# Patient Record
Sex: Female | Born: 1945
Health system: Southern US, Community
[De-identification: ages and names within clinical notes are randomized; demographics above are authoritative.]

## PROBLEM LIST (undated history)

## (undated) DIAGNOSIS — E785 Hyperlipidemia, unspecified: Secondary | ICD-10-CM

## (undated) DIAGNOSIS — I10 Essential (primary) hypertension: Secondary | ICD-10-CM

## (undated) HISTORY — PX: BREAST LUMPECTOMY: SHX2

## (undated) HISTORY — DX: Hyperlipidemia, unspecified: E78.5

## (undated) HISTORY — DX: Essential (primary) hypertension: I10

## (undated) HISTORY — PX: TANDEM AND OVOID INSERTION: SHX2480

---

## 1997-08-12 ENCOUNTER — Other Ambulatory Visit: Admission: RE | Admit: 1997-08-12 | Discharge: 1997-08-12 | Payer: Self-pay | Admitting: *Deleted

## 1998-09-06 ENCOUNTER — Other Ambulatory Visit: Admission: RE | Admit: 1998-09-06 | Discharge: 1998-09-06 | Payer: Self-pay | Admitting: *Deleted

## 1999-08-22 ENCOUNTER — Other Ambulatory Visit: Admission: RE | Admit: 1999-08-22 | Discharge: 1999-08-22 | Payer: Self-pay | Admitting: *Deleted

## 2000-09-27 ENCOUNTER — Other Ambulatory Visit: Admission: RE | Admit: 2000-09-27 | Discharge: 2000-09-27 | Payer: Self-pay | Admitting: Obstetrics and Gynecology

## 2000-10-03 ENCOUNTER — Ambulatory Visit (HOSPITAL_COMMUNITY): Admission: RE | Admit: 2000-10-03 | Discharge: 2000-10-03 | Payer: Self-pay | Admitting: Gastroenterology

## 2002-04-07 ENCOUNTER — Other Ambulatory Visit: Admission: RE | Admit: 2002-04-07 | Discharge: 2002-04-07 | Payer: Self-pay | Admitting: Obstetrics and Gynecology

## 2002-07-30 ENCOUNTER — Encounter: Admission: RE | Admit: 2002-07-30 | Discharge: 2002-07-30 | Payer: Self-pay | Admitting: Family Medicine

## 2004-02-22 ENCOUNTER — Ambulatory Visit: Payer: Self-pay | Admitting: Internal Medicine

## 2004-03-03 ENCOUNTER — Ambulatory Visit: Payer: Self-pay | Admitting: Internal Medicine

## 2004-04-10 ENCOUNTER — Ambulatory Visit: Payer: Self-pay | Admitting: Internal Medicine

## 2004-09-12 ENCOUNTER — Ambulatory Visit: Payer: Self-pay | Admitting: Cardiology

## 2004-09-25 ENCOUNTER — Ambulatory Visit: Payer: Self-pay | Admitting: Internal Medicine

## 2005-02-12 ENCOUNTER — Ambulatory Visit: Payer: Self-pay | Admitting: Internal Medicine

## 2005-09-18 ENCOUNTER — Ambulatory Visit: Payer: Self-pay | Admitting: Internal Medicine

## 2005-09-27 ENCOUNTER — Ambulatory Visit: Payer: Self-pay | Admitting: Internal Medicine

## 2005-10-12 ENCOUNTER — Ambulatory Visit: Payer: Self-pay | Admitting: Internal Medicine

## 2006-01-03 ENCOUNTER — Ambulatory Visit: Payer: Self-pay | Admitting: Internal Medicine

## 2006-02-01 ENCOUNTER — Ambulatory Visit: Payer: Self-pay | Admitting: Internal Medicine

## 2006-10-07 ENCOUNTER — Ambulatory Visit: Payer: Self-pay | Admitting: Internal Medicine

## 2006-10-24 ENCOUNTER — Ambulatory Visit: Payer: Self-pay | Admitting: Internal Medicine

## 2007-04-23 ENCOUNTER — Ambulatory Visit: Payer: Self-pay | Admitting: Internal Medicine

## 2007-05-12 ENCOUNTER — Ambulatory Visit: Payer: Self-pay | Admitting: Internal Medicine

## 2007-09-30 ENCOUNTER — Encounter: Admission: RE | Admit: 2007-09-30 | Discharge: 2007-09-30 | Payer: Self-pay | Admitting: Internal Medicine

## 2007-11-20 ENCOUNTER — Ambulatory Visit (HOSPITAL_COMMUNITY): Admission: RE | Admit: 2007-11-20 | Discharge: 2007-11-20 | Payer: Self-pay | Admitting: Obstetrics and Gynecology

## 2008-03-01 ENCOUNTER — Other Ambulatory Visit: Admission: RE | Admit: 2008-03-01 | Discharge: 2008-03-01 | Payer: Self-pay | Admitting: Obstetrics and Gynecology

## 2008-03-04 ENCOUNTER — Ambulatory Visit: Payer: Self-pay | Admitting: Internal Medicine

## 2008-03-19 ENCOUNTER — Ambulatory Visit: Payer: Self-pay | Admitting: Internal Medicine

## 2008-09-09 ENCOUNTER — Ambulatory Visit: Payer: Self-pay | Admitting: Internal Medicine

## 2008-10-15 ENCOUNTER — Ambulatory Visit: Payer: Self-pay | Admitting: Internal Medicine

## 2008-11-04 LAB — CONVERTED CEMR LAB: CRP, High Sensitivity: 1.5 (ref 0.00–5.00)

## 2009-03-07 ENCOUNTER — Other Ambulatory Visit: Admission: RE | Admit: 2009-03-07 | Discharge: 2009-03-07 | Payer: Self-pay | Admitting: Obstetrics and Gynecology

## 2009-03-11 ENCOUNTER — Telehealth: Payer: Self-pay | Admitting: Internal Medicine

## 2009-03-15 ENCOUNTER — Ambulatory Visit: Payer: Self-pay | Admitting: Internal Medicine

## 2009-03-16 DIAGNOSIS — E782 Mixed hyperlipidemia: Secondary | ICD-10-CM | POA: Insufficient documentation

## 2009-03-16 DIAGNOSIS — E785 Hyperlipidemia, unspecified: Secondary | ICD-10-CM

## 2009-03-16 DIAGNOSIS — I10 Essential (primary) hypertension: Secondary | ICD-10-CM | POA: Insufficient documentation

## 2009-03-21 ENCOUNTER — Ambulatory Visit: Payer: Self-pay | Admitting: Internal Medicine

## 2010-01-25 ENCOUNTER — Telehealth: Payer: Self-pay | Admitting: Internal Medicine

## 2010-01-25 ENCOUNTER — Encounter: Payer: Self-pay | Admitting: Internal Medicine

## 2010-02-09 NOTE — Progress Notes (Signed)
Summary: BLOOD WORK ORDERS  Phone Note Call from Patient Call back at Home Phone [phone]   Caller: Patient Reason for Call: Talk to Nurse Summary of Call: PT STATES SHE NEEDS BLOOD WORK ORDERS BEFORE HER APPT ON 04/07/10.  Initial call taken by: Roe Coombs,  January 25, 2010 4:29 PM  Follow-up for Phone Call        Phone Call Completed  SPOKE WITH PT TO SEE DR STALLINGS NEXT WEEK WITH BLOOD DRAW TO BE DONE THERE ORDER FAXED TO DRAW LIPOMED AND LIVER IN ADDITION TO BLOOD TO BE DONE. PT TO CALL IF UNABLE TO DO LIPOMED  AT DR [facility] OFFICE. Follow-up by: Scherrie Bateman, LPN,  January 25, 2010 5:08 PM

## 2010-02-09 NOTE — Medication Information (Signed)
Summary: Lab Orders  Lab Orders   Imported By: Marylou Mccoy 02/02/2010 10:20:27  _____________________________________________________________________  External Attachment:    Type:   Image     Comment:   External Document

## 2010-02-09 NOTE — Assessment & Plan Note (Signed)
Summary: 1 YR F/U   Visit Type:  Follow-up Primary Provider:  Dr Eldred Manges at [facility] family pratice (pt has not had an ov yet)  CC:  no cardiac complaints.  History of Present Illness: patient is a 65 year old with a history of dyslipidemia(mild) and borderline hypertension.  I saw her in clinic 1 year ago. Since seen, she denies chest pain.  Breathing is ok.  She has increased her activity significantly by taking up dancing.  Also working out.  Current Medications (verified): 1)  Caicum .... 2 Tabs Three Times A Day 2)  Vitamin D 2000 Unit  Tabs (Cholecalciferol) .Marland Kitchen.. 1 Tab Daily 3)  Fish Oil   Oil (Fish Oil) .... Omega 3  1 Tab Three Times A Day 4)  Garlic Pill .Marland Kitchen.. 1 Once Daily 5)  Phytosterol Complex 500 Mg Caps (Phytosterol Esters) .... 2 Tabs Bid 6)  Multivitamins   Tabs (Multiple Vitamin) .Marland Kitchen.. 1 Tab Qd 7)  Aspirin 81 Mg  Tabs (Aspirin) .... (On Hold Due To Upcoming Procedure)  Allergies (verified): No Known Drug Allergies  Past History:  Past Medical History: Last updated: 03/16/2009 Current Problems:  DYSLIPIDEMIA (ICD-272.4) HYPERTENSION (ICD-401.9)  Past Surgical History: Lumpectomy Tand A  Family History: Father:  DM, dementia Mother:dementia MGF ; heart dz 21s  Social History: Married.  No tobacco. Occasional ETOH  Review of Systems       ALl systems reviewed.  Negative to above problem except as noted above.  Vital Signs:  Patient profile:   65 year old female Height:      64 inches Weight:      132 pounds BMI:     22.74 Pulse rate:   97 / minute BP sitting:   157 / 88  (left arm) Cuff size:   regular  Vitals Entered By: Burnett Kanaris, CNA (March 21, 2009 11:19 AM)  Physical Exam  Additional Exam:  Patient is in NAD HEENT:  Normocephalic, atraumatic. EOMI, PERRLA.  Neck: JVP is normal. No thyromegaly. No bruits.  Lungs: clear to auscultation. No rales no wheezes.  Heart: Regular rate and rhythm. Normal S1, S2. No S3.   No significant  murmurs. PMI not displaced.  Abdomen:  Supple, nontender. Normal bowel sounds. No masses. No hepatomegaly.  Extremities:   Good distal pulses throughout. No lower extremity edema.  Musculoskeletal :moving all extremities.  Neuro:   alert and oriented x3.    Impression & Recommendations:  Problem # 1:  DYSLIPIDEMIA (ICD-272.4) I have reviewed her lipomed with her.  Overall, not bad.  I would continue diet and exercise.  F/U 1 year  Problem # 2:  HYPERTENSION (ICD-401.9) Assessment: Unchanged BP here is up.  She brings in log from home.  Average in 130s/  (116-155)   Most in 130s/ She will follow.  Have bp cuff calibrated. Her updated medication list for this problem includes:    Aspirin 81 Mg Tabs (Aspirin) ..... (on hold due to upcoming procedure)  Orders: EKG w/ Interpretation (93000)  Patient Instructions: 1)  F/U 1 yr. 2)  Monitor bp.

## 2010-02-09 NOTE — Progress Notes (Signed)
Summary: lab work   Phone Note Call from Patient Call back at [employer] [phone]   Caller: Patient Reason for Call: Talk to Nurse, Lab or Test Results Details for Reason: Per pt calling, pt would like to have lab work before appt on 3/14. Initial call taken by: Lorne Skeens,  March 11, 2009 9:30 AM  Follow-up for Phone Call        LM for call back.  per pt calling back, have permisssion to leave message on home voice mail. wants early am appt at [facility] long. mon-wed. [phone] Lorne Skeens  March 11, 2009 2:13 PM  Follow-up by: Suzan Garibaldi RN  Additional Follow-up for Phone Call Additional follow up Details #1::        [facility] ok to have lab work done on 3/7 8 or 9 fasting at [facility]. Lab. Additional Follow-up by: Suzan Garibaldi RN

## 2010-03-24 ENCOUNTER — Ambulatory Visit: Payer: Self-pay | Admitting: Internal Medicine

## 2010-04-07 ENCOUNTER — Ambulatory Visit: Payer: Self-pay | Admitting: Internal Medicine

## 2010-05-23 NOTE — Assessment & Plan Note (Signed)
[facility] HEALTHCARE                            CARDIOLOGY OFFICE NOTE   ALEEZA, BELLVILLE                    MRN:          [id number]  DATE:03/19/2008                            DOB:          25-Sep-1945    IDENTIFICATION:  Ms. Shannahan is a woman who I have followed in clinic  for hypertension and dyslipidemia.  I last saw her back in May 2009.   In the interval, she has done okay.  She remains very active.  She is  working with the therapist.  She has osteoporosis.  She is doing  exercises.   Denies shortness of breath.   CURRENT MEDICINES:  Calcium with D, multivitamin, fish oil t.i.d., and  vitamin D.   PHYSICAL EXAMINATION:  GENERAL:  On exam, the patient is in no distress.  VITAL SIGNS:  Blood pressure is 148/82 on our cuff and on her cuff,  153/90; pulse 96-100; and weight 127, down from 133 one year ago.  NECK:  No bruits.  LUNGS:  Clear.  CARDIAC:  Regular rate and rhythm, S1 and S2.  No significant murmurs.  ABDOMEN:  Benign.  EXTREMITIES:  No edema.   Pressures from home range in the 110s-140s over 60s-80s.   LABORATORY VALUES:  (LipoMed panel from February 25, total cholesterol  of 213, LDL of 134 with 1418 particles, HDL of 62, and triglycerides of  86.   IMPRESSION:  1. Hypertension.  She always is little more anxious here in the      clinic.  Pressures at home look adequate.  I would follow.  I      encouraged her to stay active.  2. Dyslipidemia.  Her numbers were better in the past than now.  We      talked about her diet, which she is working harder.  She would like      to avoid medicines.   I have recommend trying the Benecol over-the-counter spread, and we will  check lipids in about 6 months.  She has talked to dietary in the past.   One consideration would be to get a carotid ultrasound to see if indeed  she has plaque buildup, which would sway up to more aggressive control.  Again, I think there is good primary  prevention data, but she is not  eager to start medicines.   I will set to see her back tentatively in 1 year.      Pricilla Riffle, MD, [facility]  Electronically Signed    PVR/MedQ  DD: 03/23/2008  DT: 03/24/2008  Job #: [id number]   cc:   Hayes Ludwig, NP

## 2010-05-23 NOTE — Assessment & Plan Note (Signed)
[facility] HEALTHCARE                            CARDIOLOGY OFFICE NOTE   Melinda Kelly, Melinda Kelly                      MRN:          [id number]  DATE:10/24/2006                            DOB:          1945/04/22    IDENTIFICATION:  Melinda Kelly is a 66 year old woman with hypertension,  dyslipidemia.  I last saw her back in September of 2007.   In the interval, she has done okay.  She denies chest pain.  Is active  somewhat.  She was in ballet but is dropping out because it was too  stressful.  She is not doing as much walking as she used to.   She does admit to eating a lot of cheese and she has not been too  careful with her diet.   CURRENT MEDICATIONS:  Include calcium with D, multivitamin and fish oil  t.i.d.   PHYSICAL EXAMINATION:  The patient is in no distress.  Blood pressure is  150/78, pulse is 87 and regular, weight 134.  LUNGS are clear.  NECK:  JVP is normal.  CARDIAC:  Regular rate and rhythm, S1, S2, no S3, no murmurs.  ABDOMEN:  Benign.  EXTREMITIES:  No edema.   IMPRESSION:  1. Hypertension.  Higher today.  Pressures at home have been in the      120s to 150s over 68 to 80s.  I would like them to be in the 120s.      I do not like the 150 number.  She is going to check her blood      pressure a couple of times per week and send in after 2 months and      I will be back in touch with her again.  She is always reluctant      for medications.  2. Dyslipidemia.  Recent lipid panel is not good, with her LDL rising      to 156, total particle number is 1521.  Her HDL is 58.  I have      talked this over with her.  I think she can make some dietary      modifications and we would re-check in April.   I will be in touch with her once I've seen the blood pressures from  home, otherwise plan followup in April with fasting lipids,     Pricilla Riffle, MD, [facility]  Electronically Signed    PVR/MedQ  DD: 10/24/2006  DT: 10/25/2006  Job #:  [id number]   cc:   Derek Jack, M.D.

## 2010-05-23 NOTE — Assessment & Plan Note (Signed)
[facility] HEALTHCARE                            CARDIOLOGY OFFICE NOTE   JERNI, SELMER                    MRN:          [id number]  DATE:05/12/2007                            DOB:          Apr 19, 1945    IDENTIFICATION:  Ms. Polasek is a 65 year old woman whom I have followed  in clinic.  She has a history of dyslipidemia and borderline  hypertension, and I last saw her in October 2008.   In the interval, the patient says she has increased her physical  activity significantly.  She was diagnosed with osteoporosis and is at  the gym now try to increase her weightbearing exercises.  Some of this  includes walking the treadmill.  She is also lifting weights.  She is  watching her diet.  She denies chest pain.  Breathing is okay.   CURRENT MEDICINES:  1. Calcium with D.  2. Multivitamin.  3. Fish oil.  4. Vitamin D 1000 mg daily.  5. Folic acid.   PHYSICAL EXAMINATION:  GENERAL:  The patient is in no distress.  VITAL SIGNS:  Blood pressure 145/88.  Her blood pressure today  correlates with her home cuff, and blood pressures from home have been  in the 110-140s/70s.  Weight is 13, stable.  LUNGS:  Clear.  CARDIAC:  Regular rate and rhythm.  S1, S2.  No murmurs.  ABDOMEN:  Benign.  EXTREMITIES:  No edema.   LipoMed panel shows LDL of 123, total cholesterol 209, HDL 66.  Her  total particle number is minimally up at 1168, was a small LDL,  measuring only 286.   IMPRESSION:  1. Hypertension.  May be some white coat effect.  Her blood pressure      cuff correlates.  I would continue to watch, especially if she is      exercising.  Will not begin therapy.  2. Dyslipidemia.  Her lipids have really been up and down.  She will      watch it, then not watch it.  Her particle number will increase and      then come down.  These are better numbers than I have seen in the      past.  Still, she says she is very diligent and wants to work out      and  would like to avoid medicine.  I will go ahead and check in the      winter.  She is due to meet with the dietician.   I will set to see the patient back in December 2009 or January 2010,  sooner if problems develop.  At that time, I will check a LipoMed  before.     Pricilla Riffle, MD, [facility]  Electronically Signed    PVR/MedQ  DD: 05/12/2007  DT: 05/12/2007  Job #: [id number]

## 2010-05-26 NOTE — Assessment & Plan Note (Signed)
[facility] HEALTHC[facility]                              CARDIOLOGY OFFICE NOTE   Melinda Kelly, Melinda Kelly                      MRN:          [id number]  DATE:09/27/2005                            DOB:          13-May-1945    IDENTIFICATION:  Ms. Melinda Kelly is a 65 year old woman with a history of  hypertension and dyslipidemia.  I last saw her back in February.   In the interval, she has done well.  She was swimming this summer.  She is  watching what she eats.  She has actually entered into adult ballet and is  happy with this, enjoying her job more.   CURRENT MEDICATIONS:  1. Calcium with D.  2. Multivitamin every day.   REVIEW OF SYSTEMS:  No chest pain, no shortness of breath.   REVIEW OF SYSTEMS:  Blood pressure at home were in the 120s, 130s, highest  159.   PHYSICAL EXAMINATION:  GENERAL:  The patient is in no distress.  VITAL SIGNS:  Blood pressure 162/84, on her machine from home 134/88 (the  patient says it has been calibrated before), pulse is 102, weight 130.  LUNGS:  Clear.  CARDIAC:  Regular rate and rhythm.  S1 S2.  No S3.  No significant murmurs.  ABDOMEN:  Benign.  EXTREMITIES:  No edema.   LABORATORY:  The patient had lipomed panel done.  I do not have the results  at present but her small LDL have continued to improve with her LDL now less  than 100 particles in the large pattern A.   IMPRESSION:  1. Dyslipidemia, improved with diet.  I gave her a copy of her lab work      but do not have at the time of dictation.  I encouraged her to continue      on as she is doing with exercise and diet and would follow up in one      year's time.  2. Hypertension.  The patient may have some white coat effect.  Her blood      pressure is a little bit up and down.  I would not treat for now.      Again, will need to follow.  She will come in      every couple of months mainly for insurance issues to make sure blood      pressure is within normal range.   Again, I will follow up in one year.                                Pricilla Riffle, MD, [facility]    PVR/MedQ  DD:  09/27/2005  DT:  09/29/2005  Job #:  [id number]   cc:   Derek Jack

## 2010-05-26 NOTE — Procedures (Signed)
[place]  Patient:    Melinda Kelly, Melinda Kelly Visit Number: [id number] MRN: [id number]          Service Type: END Location: ENDO Attending Physician:  Charna Elizabeth Dictated by:   Anselmo Rod, M.D. Proc. Date: 10/03/00 Admit Date:  10/03/2000   CC:         Cordelia Pen A. Rosalio Macadamia, M.D.   Procedure Report  DATE OF BIRTH:  August 16, 1945  REFERRING PHYSICIAN:  Cordelia Pen A. Rosalio Macadamia, M.D.  PROCEDURE PERFORMED:  Colonoscopy.  ENDOSCOPIST:  Anselmo Rod, M.D.  INSTRUMENT USED:  Olympus video colonoscope.  INDICATIONS FOR PROCEDURE:  The patient is a 65 year old white female with blood in stool, rule out colonic polyps, masses, hemorrhoids, etc.  PREPROCEDURE PREPARATION:  Informed consent was procured from the patient. The patient was fasted for eight hours prior to the procedure and prepped with a bottle of magnesium citrate and a gallon of NuLytely the night prior to the procedure.  PREPROCEDURE PHYSICAL:  The patient had stable vital signs.  Neck supple. Chest clear to auscultation.  S1, S2 regular.  Abdomen soft with normal abdominal bowel sounds.  DESCRIPTION OF PROCEDURE:  The patient was placed in the left lateral decubitus position and sedated with 50 mg of Demerol and 3.5 mg of Versed intravenously.  Once the patient was adequately sedated and maintained on low-flow oxygen and continuous cardiac monitoring, the Olympus video colonoscope was advanced from the rectum to the cecum without difficulty.  The entire colonic mucosa appeared healthy and without lesions, no masses, polyps, erosions, ulcerations or diverticula were seen.  The appendicular orifice and the ileocecal valve were clearly visualized and photographed.  There was a question of an extrinsic compression of the rectum for reasons not clear to me.  IMPRESSION:  Healthy-appearing colon except for extrinsic compression of the rectum.  RECOMMENDATIONS:  Repeat guaiac  and follow-up on an outpatient basis in the next two to four weeks.Dictated by:   Anselmo Rod, M.D. Attending Physician:  Charna Elizabeth DD:  10/03/00 TD:  10/03/00 Job: [id number]

## 2010-07-14 ENCOUNTER — Encounter: Payer: Self-pay | Admitting: Internal Medicine

## 2013-02-12 DIAGNOSIS — N959 Unspecified menopausal and perimenopausal disorder: Secondary | ICD-10-CM | POA: Diagnosis not present

## 2013-02-12 DIAGNOSIS — E785 Hyperlipidemia, unspecified: Secondary | ICD-10-CM | POA: Diagnosis not present

## 2013-02-12 DIAGNOSIS — M810 Age-related osteoporosis without current pathological fracture: Secondary | ICD-10-CM | POA: Diagnosis not present

## 2013-02-12 DIAGNOSIS — I10 Essential (primary) hypertension: Secondary | ICD-10-CM | POA: Diagnosis not present

## 2013-02-12 DIAGNOSIS — R634 Abnormal weight loss: Secondary | ICD-10-CM | POA: Diagnosis not present

## 2013-02-19 DIAGNOSIS — M810 Age-related osteoporosis without current pathological fracture: Secondary | ICD-10-CM | POA: Diagnosis not present

## 2013-03-31 DIAGNOSIS — Z1231 Encounter for screening mammogram for malignant neoplasm of breast: Secondary | ICD-10-CM | POA: Diagnosis not present

## 2013-05-07 DIAGNOSIS — M810 Age-related osteoporosis without current pathological fracture: Secondary | ICD-10-CM | POA: Diagnosis not present

## 2013-06-11 DIAGNOSIS — Z0000 Encounter for general adult medical examination without abnormal findings: Secondary | ICD-10-CM | POA: Diagnosis not present

## 2013-06-11 DIAGNOSIS — I10 Essential (primary) hypertension: Secondary | ICD-10-CM | POA: Diagnosis not present

## 2013-06-11 DIAGNOSIS — Z23 Encounter for immunization: Secondary | ICD-10-CM | POA: Diagnosis not present

## 2013-10-13 DIAGNOSIS — L821 Other seborrheic keratosis: Secondary | ICD-10-CM | POA: Diagnosis not present

## 2013-10-13 DIAGNOSIS — D2262 Melanocytic nevi of left upper limb, including shoulder: Secondary | ICD-10-CM | POA: Diagnosis not present

## 2013-10-13 DIAGNOSIS — L814 Other melanin hyperpigmentation: Secondary | ICD-10-CM | POA: Diagnosis not present

## 2013-10-13 DIAGNOSIS — D225 Melanocytic nevi of trunk: Secondary | ICD-10-CM | POA: Diagnosis not present

## 2013-10-13 DIAGNOSIS — Z23 Encounter for immunization: Secondary | ICD-10-CM | POA: Diagnosis not present

## 2013-10-13 DIAGNOSIS — D692 Other nonthrombocytopenic purpura: Secondary | ICD-10-CM | POA: Diagnosis not present

## 2013-10-13 DIAGNOSIS — L720 Epidermal cyst: Secondary | ICD-10-CM | POA: Diagnosis not present

## 2013-10-13 DIAGNOSIS — L718 Other rosacea: Secondary | ICD-10-CM | POA: Diagnosis not present

## 2014-01-26 DIAGNOSIS — M810 Age-related osteoporosis without current pathological fracture: Secondary | ICD-10-CM | POA: Diagnosis not present

## 2014-01-26 DIAGNOSIS — E785 Hyperlipidemia, unspecified: Secondary | ICD-10-CM | POA: Diagnosis not present

## 2014-01-26 DIAGNOSIS — R7309 Other abnormal glucose: Secondary | ICD-10-CM | POA: Diagnosis not present

## 2014-01-26 DIAGNOSIS — I10 Essential (primary) hypertension: Secondary | ICD-10-CM | POA: Diagnosis not present

## 2014-01-26 DIAGNOSIS — N959 Unspecified menopausal and perimenopausal disorder: Secondary | ICD-10-CM | POA: Diagnosis not present

## 2014-06-17 DIAGNOSIS — Z1231 Encounter for screening mammogram for malignant neoplasm of breast: Secondary | ICD-10-CM | POA: Diagnosis not present

## 2014-07-13 DIAGNOSIS — H2513 Age-related nuclear cataract, bilateral: Secondary | ICD-10-CM | POA: Diagnosis not present

## 2014-07-27 ENCOUNTER — Other Ambulatory Visit: Payer: Self-pay | Admitting: Family Medicine

## 2014-07-27 ENCOUNTER — Other Ambulatory Visit (HOSPITAL_COMMUNITY)
Admission: RE | Admit: 2014-07-27 | Discharge: 2014-07-27 | Disposition: A | Discharge status: Home / Self Care | Payer: Medicare Other | Source: Ambulatory Visit | Attending: Family Medicine | Admitting: Family Medicine

## 2014-07-27 DIAGNOSIS — Z124 Encounter for screening for malignant neoplasm of cervix: Secondary | ICD-10-CM | POA: Insufficient documentation

## 2014-07-27 DIAGNOSIS — Z23 Encounter for immunization: Secondary | ICD-10-CM | POA: Diagnosis not present

## 2014-07-27 DIAGNOSIS — I10 Essential (primary) hypertension: Secondary | ICD-10-CM | POA: Diagnosis not present

## 2014-07-27 DIAGNOSIS — Z0000 Encounter for general adult medical examination without abnormal findings: Secondary | ICD-10-CM | POA: Diagnosis not present

## 2014-07-27 DIAGNOSIS — M810 Age-related osteoporosis without current pathological fracture: Secondary | ICD-10-CM | POA: Diagnosis not present

## 2014-07-29 LAB — CYTOLOGY - PAP

## 2014-10-12 DIAGNOSIS — L718 Other rosacea: Secondary | ICD-10-CM | POA: Diagnosis not present

## 2014-10-12 DIAGNOSIS — D692 Other nonthrombocytopenic purpura: Secondary | ICD-10-CM | POA: Diagnosis not present

## 2014-10-12 DIAGNOSIS — D225 Melanocytic nevi of trunk: Secondary | ICD-10-CM | POA: Diagnosis not present

## 2014-10-12 DIAGNOSIS — L821 Other seborrheic keratosis: Secondary | ICD-10-CM | POA: Diagnosis not present

## 2014-10-12 DIAGNOSIS — L814 Other melanin hyperpigmentation: Secondary | ICD-10-CM | POA: Diagnosis not present

## 2014-10-17 DIAGNOSIS — Z23 Encounter for immunization: Secondary | ICD-10-CM | POA: Diagnosis not present

## 2015-01-21 DIAGNOSIS — M810 Age-related osteoporosis without current pathological fracture: Secondary | ICD-10-CM | POA: Diagnosis not present

## 2015-01-21 DIAGNOSIS — R7309 Other abnormal glucose: Secondary | ICD-10-CM | POA: Diagnosis not present

## 2015-01-21 DIAGNOSIS — R7301 Impaired fasting glucose: Secondary | ICD-10-CM | POA: Diagnosis not present

## 2015-01-21 DIAGNOSIS — I10 Essential (primary) hypertension: Secondary | ICD-10-CM | POA: Diagnosis not present

## 2015-01-21 DIAGNOSIS — Z124 Encounter for screening for malignant neoplasm of cervix: Secondary | ICD-10-CM | POA: Diagnosis not present

## 2015-01-21 DIAGNOSIS — Z23 Encounter for immunization: Secondary | ICD-10-CM | POA: Diagnosis not present

## 2015-01-21 DIAGNOSIS — E785 Hyperlipidemia, unspecified: Secondary | ICD-10-CM | POA: Diagnosis not present

## 2015-01-21 DIAGNOSIS — Z0000 Encounter for general adult medical examination without abnormal findings: Secondary | ICD-10-CM | POA: Diagnosis not present

## 2015-01-21 DIAGNOSIS — N959 Unspecified menopausal and perimenopausal disorder: Secondary | ICD-10-CM | POA: Diagnosis not present

## 2015-02-03 DIAGNOSIS — N959 Unspecified menopausal and perimenopausal disorder: Secondary | ICD-10-CM | POA: Diagnosis not present

## 2015-02-03 DIAGNOSIS — I10 Essential (primary) hypertension: Secondary | ICD-10-CM | POA: Diagnosis not present

## 2015-02-03 DIAGNOSIS — M810 Age-related osteoporosis without current pathological fracture: Secondary | ICD-10-CM | POA: Diagnosis not present

## 2015-06-22 DIAGNOSIS — Z1231 Encounter for screening mammogram for malignant neoplasm of breast: Secondary | ICD-10-CM | POA: Diagnosis not present

## 2015-07-20 DIAGNOSIS — M810 Age-related osteoporosis without current pathological fracture: Secondary | ICD-10-CM | POA: Diagnosis not present

## 2015-07-27 DIAGNOSIS — S92514A Nondisplaced fracture of proximal phalanx of right lesser toe(s), initial encounter for closed fracture: Secondary | ICD-10-CM | POA: Diagnosis not present

## 2015-07-28 DIAGNOSIS — M810 Age-related osteoporosis without current pathological fracture: Secondary | ICD-10-CM | POA: Diagnosis not present

## 2015-07-28 DIAGNOSIS — Z0000 Encounter for general adult medical examination without abnormal findings: Secondary | ICD-10-CM | POA: Diagnosis not present

## 2015-07-28 DIAGNOSIS — Z7189 Other specified counseling: Secondary | ICD-10-CM | POA: Diagnosis not present

## 2015-07-28 DIAGNOSIS — I10 Essential (primary) hypertension: Secondary | ICD-10-CM | POA: Diagnosis not present

## 2015-07-28 DIAGNOSIS — N959 Unspecified menopausal and perimenopausal disorder: Secondary | ICD-10-CM | POA: Diagnosis not present

## 2015-08-31 DIAGNOSIS — H2513 Age-related nuclear cataract, bilateral: Secondary | ICD-10-CM | POA: Diagnosis not present

## 2015-10-04 DIAGNOSIS — Z23 Encounter for immunization: Secondary | ICD-10-CM | POA: Diagnosis not present

## 2015-11-28 DIAGNOSIS — D1801 Hemangioma of skin and subcutaneous tissue: Secondary | ICD-10-CM | POA: Diagnosis not present

## 2015-11-28 DIAGNOSIS — L918 Other hypertrophic disorders of the skin: Secondary | ICD-10-CM | POA: Diagnosis not present

## 2015-11-28 DIAGNOSIS — I788 Other diseases of capillaries: Secondary | ICD-10-CM | POA: Diagnosis not present

## 2015-11-28 DIAGNOSIS — L720 Epidermal cyst: Secondary | ICD-10-CM | POA: Diagnosis not present

## 2015-11-28 DIAGNOSIS — L814 Other melanin hyperpigmentation: Secondary | ICD-10-CM | POA: Diagnosis not present

## 2015-11-28 DIAGNOSIS — D225 Melanocytic nevi of trunk: Secondary | ICD-10-CM | POA: Diagnosis not present

## 2015-11-28 DIAGNOSIS — L821 Other seborrheic keratosis: Secondary | ICD-10-CM | POA: Diagnosis not present

## 2015-11-28 DIAGNOSIS — D692 Other nonthrombocytopenic purpura: Secondary | ICD-10-CM | POA: Diagnosis not present

## 2015-11-28 DIAGNOSIS — L718 Other rosacea: Secondary | ICD-10-CM | POA: Diagnosis not present

## 2016-06-25 DIAGNOSIS — Z1231 Encounter for screening mammogram for malignant neoplasm of breast: Secondary | ICD-10-CM | POA: Diagnosis not present

## 2016-08-07 DIAGNOSIS — Z0000 Encounter for general adult medical examination without abnormal findings: Secondary | ICD-10-CM | POA: Diagnosis not present

## 2016-08-07 DIAGNOSIS — N959 Unspecified menopausal and perimenopausal disorder: Secondary | ICD-10-CM | POA: Diagnosis not present

## 2016-08-07 DIAGNOSIS — Z1159 Encounter for screening for other viral diseases: Secondary | ICD-10-CM | POA: Diagnosis not present

## 2016-08-07 DIAGNOSIS — M810 Age-related osteoporosis without current pathological fracture: Secondary | ICD-10-CM | POA: Diagnosis not present

## 2016-08-07 DIAGNOSIS — I10 Essential (primary) hypertension: Secondary | ICD-10-CM | POA: Diagnosis not present

## 2016-08-07 DIAGNOSIS — Z7189 Other specified counseling: Secondary | ICD-10-CM | POA: Diagnosis not present

## 2016-08-09 DIAGNOSIS — M810 Age-related osteoporosis without current pathological fracture: Secondary | ICD-10-CM | POA: Diagnosis not present

## 2016-08-09 DIAGNOSIS — R7301 Impaired fasting glucose: Secondary | ICD-10-CM | POA: Diagnosis not present

## 2016-08-09 DIAGNOSIS — Z1389 Encounter for screening for other disorder: Secondary | ICD-10-CM | POA: Diagnosis not present

## 2016-08-09 DIAGNOSIS — I10 Essential (primary) hypertension: Secondary | ICD-10-CM | POA: Diagnosis not present

## 2016-08-09 DIAGNOSIS — E785 Hyperlipidemia, unspecified: Secondary | ICD-10-CM | POA: Diagnosis not present

## 2016-08-09 DIAGNOSIS — N959 Unspecified menopausal and perimenopausal disorder: Secondary | ICD-10-CM | POA: Diagnosis not present

## 2016-08-09 DIAGNOSIS — Z124 Encounter for screening for malignant neoplasm of cervix: Secondary | ICD-10-CM | POA: Diagnosis not present

## 2016-08-09 DIAGNOSIS — Z0000 Encounter for general adult medical examination without abnormal findings: Secondary | ICD-10-CM | POA: Diagnosis not present

## 2016-08-14 DIAGNOSIS — H2513 Age-related nuclear cataract, bilateral: Secondary | ICD-10-CM | POA: Diagnosis not present

## 2016-10-19 DIAGNOSIS — Z23 Encounter for immunization: Secondary | ICD-10-CM | POA: Diagnosis not present

## 2016-11-07 DIAGNOSIS — R69 Illness, unspecified: Secondary | ICD-10-CM | POA: Diagnosis not present

## 2016-11-27 DIAGNOSIS — D225 Melanocytic nevi of trunk: Secondary | ICD-10-CM | POA: Diagnosis not present

## 2016-11-27 DIAGNOSIS — D692 Other nonthrombocytopenic purpura: Secondary | ICD-10-CM | POA: Diagnosis not present

## 2016-11-27 DIAGNOSIS — L821 Other seborrheic keratosis: Secondary | ICD-10-CM | POA: Diagnosis not present

## 2016-11-27 DIAGNOSIS — L720 Epidermal cyst: Secondary | ICD-10-CM | POA: Diagnosis not present

## 2016-11-27 DIAGNOSIS — D2272 Melanocytic nevi of left lower limb, including hip: Secondary | ICD-10-CM | POA: Diagnosis not present

## 2016-11-27 DIAGNOSIS — L814 Other melanin hyperpigmentation: Secondary | ICD-10-CM | POA: Diagnosis not present

## 2016-11-27 DIAGNOSIS — D1801 Hemangioma of skin and subcutaneous tissue: Secondary | ICD-10-CM | POA: Diagnosis not present

## 2016-11-27 DIAGNOSIS — L718 Other rosacea: Secondary | ICD-10-CM | POA: Diagnosis not present

## 2016-11-27 DIAGNOSIS — D2262 Melanocytic nevi of left upper limb, including shoulder: Secondary | ICD-10-CM | POA: Diagnosis not present

## 2017-04-12 DIAGNOSIS — L720 Epidermal cyst: Secondary | ICD-10-CM | POA: Diagnosis not present

## 2017-04-16 DIAGNOSIS — W57XXXA Bitten or stung by nonvenomous insect and other nonvenomous arthropods, initial encounter: Secondary | ICD-10-CM | POA: Diagnosis not present

## 2017-04-16 DIAGNOSIS — S80869A Insect bite (nonvenomous), unspecified lower leg, initial encounter: Secondary | ICD-10-CM | POA: Diagnosis not present

## 2017-05-03 DIAGNOSIS — L720 Epidermal cyst: Secondary | ICD-10-CM | POA: Diagnosis not present

## 2017-05-07 DIAGNOSIS — L720 Epidermal cyst: Secondary | ICD-10-CM | POA: Diagnosis not present

## 2017-05-15 DIAGNOSIS — R69 Illness, unspecified: Secondary | ICD-10-CM | POA: Diagnosis not present

## 2017-05-17 DIAGNOSIS — M79641 Pain in right hand: Secondary | ICD-10-CM | POA: Diagnosis not present

## 2017-05-17 DIAGNOSIS — M25531 Pain in right wrist: Secondary | ICD-10-CM | POA: Diagnosis not present

## 2017-05-28 DIAGNOSIS — S52501A Unspecified fracture of the lower end of right radius, initial encounter for closed fracture: Secondary | ICD-10-CM | POA: Diagnosis not present

## 2017-05-28 DIAGNOSIS — S63501A Unspecified sprain of right wrist, initial encounter: Secondary | ICD-10-CM | POA: Diagnosis not present

## 2017-05-28 DIAGNOSIS — L0889 Other specified local infections of the skin and subcutaneous tissue: Secondary | ICD-10-CM | POA: Diagnosis not present

## 2017-06-04 DIAGNOSIS — S52501A Unspecified fracture of the lower end of right radius, initial encounter for closed fracture: Secondary | ICD-10-CM | POA: Diagnosis not present

## 2017-06-20 DIAGNOSIS — S52501A Unspecified fracture of the lower end of right radius, initial encounter for closed fracture: Secondary | ICD-10-CM | POA: Diagnosis not present

## 2017-06-25 DIAGNOSIS — M79671 Pain in right foot: Secondary | ICD-10-CM | POA: Diagnosis not present

## 2017-06-25 DIAGNOSIS — M25551 Pain in right hip: Secondary | ICD-10-CM | POA: Diagnosis not present

## 2017-06-25 DIAGNOSIS — M25561 Pain in right knee: Secondary | ICD-10-CM | POA: Diagnosis not present

## 2017-06-28 DIAGNOSIS — M79661 Pain in right lower leg: Secondary | ICD-10-CM | POA: Diagnosis not present

## 2017-06-28 DIAGNOSIS — R2689 Other abnormalities of gait and mobility: Secondary | ICD-10-CM | POA: Diagnosis not present

## 2017-07-02 DIAGNOSIS — M79661 Pain in right lower leg: Secondary | ICD-10-CM | POA: Diagnosis not present

## 2017-07-02 DIAGNOSIS — R2689 Other abnormalities of gait and mobility: Secondary | ICD-10-CM | POA: Diagnosis not present

## 2017-07-03 DIAGNOSIS — R2689 Other abnormalities of gait and mobility: Secondary | ICD-10-CM | POA: Diagnosis not present

## 2017-07-03 DIAGNOSIS — M79661 Pain in right lower leg: Secondary | ICD-10-CM | POA: Diagnosis not present

## 2017-07-04 DIAGNOSIS — Z9889 Other specified postprocedural states: Secondary | ICD-10-CM | POA: Diagnosis not present

## 2017-07-08 DIAGNOSIS — R2689 Other abnormalities of gait and mobility: Secondary | ICD-10-CM | POA: Diagnosis not present

## 2017-07-08 DIAGNOSIS — M79661 Pain in right lower leg: Secondary | ICD-10-CM | POA: Diagnosis not present

## 2017-07-09 DIAGNOSIS — M25571 Pain in right ankle and joints of right foot: Secondary | ICD-10-CM | POA: Diagnosis not present

## 2017-07-09 DIAGNOSIS — M25561 Pain in right knee: Secondary | ICD-10-CM | POA: Diagnosis not present

## 2017-07-10 DIAGNOSIS — M25631 Stiffness of right wrist, not elsewhere classified: Secondary | ICD-10-CM | POA: Diagnosis not present

## 2017-07-10 DIAGNOSIS — S52501D Unspecified fracture of the lower end of right radius, subsequent encounter for closed fracture with routine healing: Secondary | ICD-10-CM | POA: Diagnosis not present

## 2017-07-15 DIAGNOSIS — Z1231 Encounter for screening mammogram for malignant neoplasm of breast: Secondary | ICD-10-CM | POA: Diagnosis not present

## 2017-07-16 DIAGNOSIS — M79661 Pain in right lower leg: Secondary | ICD-10-CM | POA: Diagnosis not present

## 2017-07-16 DIAGNOSIS — R2689 Other abnormalities of gait and mobility: Secondary | ICD-10-CM | POA: Diagnosis not present

## 2017-07-17 DIAGNOSIS — S52501D Unspecified fracture of the lower end of right radius, subsequent encounter for closed fracture with routine healing: Secondary | ICD-10-CM | POA: Diagnosis not present

## 2017-07-17 DIAGNOSIS — M25631 Stiffness of right wrist, not elsewhere classified: Secondary | ICD-10-CM | POA: Diagnosis not present

## 2017-07-18 DIAGNOSIS — M79661 Pain in right lower leg: Secondary | ICD-10-CM | POA: Diagnosis not present

## 2017-07-18 DIAGNOSIS — R2689 Other abnormalities of gait and mobility: Secondary | ICD-10-CM | POA: Diagnosis not present

## 2017-07-22 DIAGNOSIS — M79661 Pain in right lower leg: Secondary | ICD-10-CM | POA: Diagnosis not present

## 2017-07-22 DIAGNOSIS — R2689 Other abnormalities of gait and mobility: Secondary | ICD-10-CM | POA: Diagnosis not present

## 2017-07-24 DIAGNOSIS — S52501D Unspecified fracture of the lower end of right radius, subsequent encounter for closed fracture with routine healing: Secondary | ICD-10-CM | POA: Diagnosis not present

## 2017-07-24 DIAGNOSIS — M25631 Stiffness of right wrist, not elsewhere classified: Secondary | ICD-10-CM | POA: Diagnosis not present

## 2017-07-25 DIAGNOSIS — R2689 Other abnormalities of gait and mobility: Secondary | ICD-10-CM | POA: Diagnosis not present

## 2017-07-25 DIAGNOSIS — M25531 Pain in right wrist: Secondary | ICD-10-CM | POA: Diagnosis not present

## 2017-07-25 DIAGNOSIS — M79661 Pain in right lower leg: Secondary | ICD-10-CM | POA: Diagnosis not present

## 2017-07-26 DIAGNOSIS — M25632 Stiffness of left wrist, not elsewhere classified: Secondary | ICD-10-CM | POA: Diagnosis not present

## 2017-07-26 DIAGNOSIS — S52502D Unspecified fracture of the lower end of left radius, subsequent encounter for closed fracture with routine healing: Secondary | ICD-10-CM | POA: Diagnosis not present

## 2017-07-29 DIAGNOSIS — M79661 Pain in right lower leg: Secondary | ICD-10-CM | POA: Diagnosis not present

## 2017-07-29 DIAGNOSIS — R2689 Other abnormalities of gait and mobility: Secondary | ICD-10-CM | POA: Diagnosis not present

## 2017-07-30 DIAGNOSIS — S52501D Unspecified fracture of the lower end of right radius, subsequent encounter for closed fracture with routine healing: Secondary | ICD-10-CM | POA: Diagnosis not present

## 2017-07-30 DIAGNOSIS — M25631 Stiffness of right wrist, not elsewhere classified: Secondary | ICD-10-CM | POA: Diagnosis not present

## 2017-07-31 DIAGNOSIS — M810 Age-related osteoporosis without current pathological fracture: Secondary | ICD-10-CM | POA: Diagnosis not present

## 2017-08-01 DIAGNOSIS — M79661 Pain in right lower leg: Secondary | ICD-10-CM | POA: Diagnosis not present

## 2017-08-01 DIAGNOSIS — R2689 Other abnormalities of gait and mobility: Secondary | ICD-10-CM | POA: Diagnosis not present

## 2017-08-06 DIAGNOSIS — S52501D Unspecified fracture of the lower end of right radius, subsequent encounter for closed fracture with routine healing: Secondary | ICD-10-CM | POA: Diagnosis not present

## 2017-08-06 DIAGNOSIS — M25561 Pain in right knee: Secondary | ICD-10-CM | POA: Diagnosis not present

## 2017-08-06 DIAGNOSIS — M25631 Stiffness of right wrist, not elsewhere classified: Secondary | ICD-10-CM | POA: Diagnosis not present

## 2017-08-08 DIAGNOSIS — M25631 Stiffness of right wrist, not elsewhere classified: Secondary | ICD-10-CM | POA: Diagnosis not present

## 2017-08-08 DIAGNOSIS — S52501D Unspecified fracture of the lower end of right radius, subsequent encounter for closed fracture with routine healing: Secondary | ICD-10-CM | POA: Diagnosis not present

## 2017-08-12 DIAGNOSIS — Z1211 Encounter for screening for malignant neoplasm of colon: Secondary | ICD-10-CM | POA: Diagnosis not present

## 2017-08-12 DIAGNOSIS — Z0000 Encounter for general adult medical examination without abnormal findings: Secondary | ICD-10-CM | POA: Diagnosis not present

## 2017-08-12 DIAGNOSIS — Z1159 Encounter for screening for other viral diseases: Secondary | ICD-10-CM | POA: Diagnosis not present

## 2017-08-12 DIAGNOSIS — R7301 Impaired fasting glucose: Secondary | ICD-10-CM | POA: Diagnosis not present

## 2017-08-12 DIAGNOSIS — E785 Hyperlipidemia, unspecified: Secondary | ICD-10-CM | POA: Diagnosis not present

## 2017-08-12 DIAGNOSIS — M810 Age-related osteoporosis without current pathological fracture: Secondary | ICD-10-CM | POA: Diagnosis not present

## 2017-08-12 DIAGNOSIS — I10 Essential (primary) hypertension: Secondary | ICD-10-CM | POA: Diagnosis not present

## 2017-08-12 DIAGNOSIS — N959 Unspecified menopausal and perimenopausal disorder: Secondary | ICD-10-CM | POA: Diagnosis not present

## 2017-08-14 DIAGNOSIS — M25631 Stiffness of right wrist, not elsewhere classified: Secondary | ICD-10-CM | POA: Diagnosis not present

## 2017-08-14 DIAGNOSIS — S52501D Unspecified fracture of the lower end of right radius, subsequent encounter for closed fracture with routine healing: Secondary | ICD-10-CM | POA: Diagnosis not present

## 2017-08-15 DIAGNOSIS — I10 Essential (primary) hypertension: Secondary | ICD-10-CM | POA: Diagnosis not present

## 2017-08-15 DIAGNOSIS — E785 Hyperlipidemia, unspecified: Secondary | ICD-10-CM | POA: Diagnosis not present

## 2017-08-15 DIAGNOSIS — R7303 Prediabetes: Secondary | ICD-10-CM | POA: Diagnosis not present

## 2017-08-15 DIAGNOSIS — M8000XS Age-related osteoporosis with current pathological fracture, unspecified site, sequela: Secondary | ICD-10-CM | POA: Diagnosis not present

## 2017-08-15 DIAGNOSIS — Z0000 Encounter for general adult medical examination without abnormal findings: Secondary | ICD-10-CM | POA: Diagnosis not present

## 2017-08-15 DIAGNOSIS — Z1389 Encounter for screening for other disorder: Secondary | ICD-10-CM | POA: Diagnosis not present

## 2017-08-16 DIAGNOSIS — M25631 Stiffness of right wrist, not elsewhere classified: Secondary | ICD-10-CM | POA: Diagnosis not present

## 2017-08-16 DIAGNOSIS — S52501D Unspecified fracture of the lower end of right radius, subsequent encounter for closed fracture with routine healing: Secondary | ICD-10-CM | POA: Diagnosis not present

## 2017-08-19 DIAGNOSIS — M25631 Stiffness of right wrist, not elsewhere classified: Secondary | ICD-10-CM | POA: Diagnosis not present

## 2017-08-19 DIAGNOSIS — S52501D Unspecified fracture of the lower end of right radius, subsequent encounter for closed fracture with routine healing: Secondary | ICD-10-CM | POA: Diagnosis not present

## 2017-08-21 DIAGNOSIS — S52501D Unspecified fracture of the lower end of right radius, subsequent encounter for closed fracture with routine healing: Secondary | ICD-10-CM | POA: Diagnosis not present

## 2017-08-21 DIAGNOSIS — M25631 Stiffness of right wrist, not elsewhere classified: Secondary | ICD-10-CM | POA: Diagnosis not present

## 2017-08-26 DIAGNOSIS — S52501D Unspecified fracture of the lower end of right radius, subsequent encounter for closed fracture with routine healing: Secondary | ICD-10-CM | POA: Diagnosis not present

## 2017-08-26 DIAGNOSIS — M25631 Stiffness of right wrist, not elsewhere classified: Secondary | ICD-10-CM | POA: Diagnosis not present

## 2017-08-29 DIAGNOSIS — M1711 Unilateral primary osteoarthritis, right knee: Secondary | ICD-10-CM | POA: Diagnosis not present

## 2017-08-29 DIAGNOSIS — M25561 Pain in right knee: Secondary | ICD-10-CM | POA: Diagnosis not present

## 2017-09-02 DIAGNOSIS — S52501D Unspecified fracture of the lower end of right radius, subsequent encounter for closed fracture with routine healing: Secondary | ICD-10-CM | POA: Diagnosis not present

## 2017-09-02 DIAGNOSIS — M25631 Stiffness of right wrist, not elsewhere classified: Secondary | ICD-10-CM | POA: Diagnosis not present

## 2017-09-06 DIAGNOSIS — S52501D Unspecified fracture of the lower end of right radius, subsequent encounter for closed fracture with routine healing: Secondary | ICD-10-CM | POA: Diagnosis not present

## 2017-09-06 DIAGNOSIS — M25631 Stiffness of right wrist, not elsewhere classified: Secondary | ICD-10-CM | POA: Diagnosis not present

## 2017-09-17 DIAGNOSIS — L02214 Cutaneous abscess of groin: Secondary | ICD-10-CM | POA: Diagnosis not present

## 2017-09-24 DIAGNOSIS — R69 Illness, unspecified: Secondary | ICD-10-CM | POA: Diagnosis not present

## 2017-10-22 DIAGNOSIS — L723 Sebaceous cyst: Secondary | ICD-10-CM | POA: Diagnosis not present

## 2017-11-11 DIAGNOSIS — H00013 Hordeolum externum right eye, unspecified eyelid: Secondary | ICD-10-CM | POA: Diagnosis not present

## 2017-11-28 DIAGNOSIS — F0390 Unspecified dementia without behavioral disturbance: Secondary | ICD-10-CM | POA: Diagnosis not present

## 2017-11-29 ENCOUNTER — Other Ambulatory Visit: Payer: Self-pay | Admitting: Internal Medicine

## 2017-11-29 ENCOUNTER — Other Ambulatory Visit (HOSPITAL_COMMUNITY): Payer: Self-pay | Admitting: Internal Medicine

## 2017-11-29 DIAGNOSIS — F0390 Unspecified dementia without behavioral disturbance: Secondary | ICD-10-CM

## 2017-12-03 DIAGNOSIS — L720 Epidermal cyst: Secondary | ICD-10-CM | POA: Diagnosis not present

## 2017-12-03 DIAGNOSIS — L821 Other seborrheic keratosis: Secondary | ICD-10-CM | POA: Diagnosis not present

## 2017-12-03 DIAGNOSIS — L84 Corns and callosities: Secondary | ICD-10-CM | POA: Diagnosis not present

## 2017-12-03 DIAGNOSIS — L905 Scar conditions and fibrosis of skin: Secondary | ICD-10-CM | POA: Diagnosis not present

## 2017-12-03 DIAGNOSIS — L814 Other melanin hyperpigmentation: Secondary | ICD-10-CM | POA: Diagnosis not present

## 2017-12-03 DIAGNOSIS — D225 Melanocytic nevi of trunk: Secondary | ICD-10-CM | POA: Diagnosis not present

## 2017-12-03 DIAGNOSIS — D692 Other nonthrombocytopenic purpura: Secondary | ICD-10-CM | POA: Diagnosis not present

## 2017-12-04 ENCOUNTER — Ambulatory Visit (HOSPITAL_COMMUNITY)
Admission: RE | Admit: 2017-12-04 | Discharge: 2017-12-04 | Disposition: A | Discharge status: Home / Self Care | Payer: PPO | Source: Ambulatory Visit | Attending: Internal Medicine | Admitting: Internal Medicine

## 2017-12-04 DIAGNOSIS — I6782 Cerebral ischemia: Secondary | ICD-10-CM | POA: Diagnosis not present

## 2017-12-04 DIAGNOSIS — G319 Degenerative disease of nervous system, unspecified: Secondary | ICD-10-CM | POA: Insufficient documentation

## 2017-12-04 DIAGNOSIS — F0390 Unspecified dementia without behavioral disturbance: Secondary | ICD-10-CM | POA: Diagnosis not present

## 2017-12-04 LAB — POCT I-STAT CREATININE: Creatinine, Ser: 0.6 mg/dL (ref 0.44–1.00)

## 2017-12-04 MED ORDER — GADOBUTROL 1 MMOL/ML IV SOLN
5.0000 mL | Freq: Once | INTRAVENOUS | Status: AC | PRN
  Administered 2017-12-04: 5 mL via INTRAVENOUS

## 2017-12-10 DIAGNOSIS — M25561 Pain in right knee: Secondary | ICD-10-CM | POA: Diagnosis not present

## 2017-12-16 ENCOUNTER — Other Ambulatory Visit: Payer: Self-pay

## 2017-12-16 ENCOUNTER — Ambulatory Visit: Payer: PPO | Attending: Internal Medicine

## 2017-12-16 DIAGNOSIS — R482 Apraxia: Secondary | ICD-10-CM | POA: Diagnosis not present

## 2017-12-16 DIAGNOSIS — R4701 Aphasia: Secondary | ICD-10-CM | POA: Insufficient documentation

## 2017-12-16 NOTE — Therapy (Signed)
[facility] [address] [place], [place], [address] Phone: [phone]   Fax:  [phone]  Speech Language Pathology Evaluation  Patient Details  Name: Melinda Kelly MRN: [id number] Date of Birth: February 24, 1945 Referring Provider (SLP): Deanna Artis MD   Encounter Date: 12/16/2017  End of Session - 12/16/17 1720    Visit Number  1    Number of Visits  17    Date for SLP Re-Evaluation  03/14/18    SLP Start Time  1404    SLP Stop Time   1450    SLP Time Calculation (min)  46 min    Activity Tolerance  Patient tolerated treatment well       Past Medical History:  Diagnosis Date  . Dyslipidemia   . HTN (hypertension)      There were no vitals filed for this visit.  Subjective Assessment - 12/16/17 1416    Subjective  "This has - affected my - spilling - spelling too."    Currently in Pain?  No/denies         SLP Evaluation [facility] - 12/16/17 1416      SLP Visit Information   SLP Received On  12/16/17    Referring Provider (SLP)  Laurelyn Sickle, Marcelyn Ditty MD    Onset Date  "about a year"    Medical Diagnosis  dementia without behavioral disturbance, aphasia      Subjective   Patient/Family Stated Goal  "I would like to have some tools..." "I would like to be able to talk again..."      General Information   HPI  Pt with onset of difficulty finding words, began approx one year ago, gradual onset. Pt stated a friend brought it to her attention originally. Pt and husband decided to get evaled at [facility], resulting diagnosis of demential without behavioral disturbance, aphasia, ?primary progressive aphasia      Prior Functional Status   Cognitive/Linguistic Baseline  Within functional limits    Type of Home  House     Lives With  Spouse      Cognition   Overall Cognitive Status  Impaired/Different from baseline   baesd upon MoCA from [facility]     Auditory Comprehension   Overall Auditory Comprehension   Impaired   rare required repeat of verbal directions   Commands  Impaired    Conversation  --   appeared at least [facility]     Verbal Expression   Overall Verbal Expression  Impaired    Naming  Impairment    Other Naming Comments  pt was at mean for verbal fluency (animals named in one minute), but stated her performance would have been better prior to current difficulties. On [place] - 2 (odds), pt fell below WNL at 19/30 where scores above ~25 are considered WNL (mean=27, standard dev. 2.06).      Written Expression   Dominant Hand  Right    Written Expression  Exceptions to [facility]    Overall Writen Expression  Pt could not write "power of attorney" or "living will" and req'd SLP cues to assist - she states her "spelling is bad" as another symptom       Oral Motor/Sensory Function   Overall Oral Motor/Sensory Function  Appears within functional limits for tasks assessed    Lingual ROM  Within Functional Limits   with verbal cues    Velum  Within Functional Limits      Motor Speech  Overall Motor Speech  Impaired    Respiration  Within functional limits    Phonation  Normal    Resonance  Within functional limits    Articulation  Impaired    Level of Impairment  Word    Intelligibility  Intelligible    Motor Planning  Impaired    Level of Impairment  Word   87% (26/30)   Motor Speech Errors  Inconsistent    Interfering Components  --   possible primary progressive aphasia   Effective Techniques  Slow rate                      SLP Education - 12/16/17 1718    Education Details  eval results, possible goals, work on "tools" (compensations) and unsure if rehab of language is going to be helpful but we will attempt    Person(s) Educated  Patient    Methods  Explanation    Comprehension  Verbalized understanding       SLP Short Term Goals - 12/16/17 1731      SLP SHORT TERM GOAL #1   Title  pt to perform apraxia/speech precision HEP of salient  words/short phrases with rare min A over 3 sessions    Time  4    Period  Weeks    Status  New      SLP SHORT TERM GOAL #2   Title  pt will functionally use speech and language compensations in 8 minutes simple conversation over 2 sessions    Time  4    Period  Weeks    Status  New      SLP SHORT TERM GOAL #3   Title  pt will be provided a quality of life questionnaire/assessment to measure pt's speech related quality of life    Time  2    Period  --   sessions   Status  New      SLP SHORT TERM GOAL #4   Title  pt will tell SLP of 3 ways to compensate for decr'd spoken or written language skills in pt's functional situations over three sessions with modified independence    Time  4    Period  Weeks    Status  New       SLP Long Term Goals - 12/16/17 1738      SLP LONG TERM GOAL #1   Title  in 5 minutes simple conversation, pt will abandon no more than 3 utterances/ideas    Time  6   (six weeks)   Period  Weeks    Status  New      SLP LONG TERM GOAL #2   Title  in 10 minutes simple conversation pt will use compensatory strategies successfully to produce speech/language in WFL/WNL manner over three sessions    Time  8    Period  Weeks    Status  New      SLP LONG TERM GOAL #3   Title  pt will perform speech precision/verbal apraxia HEP with modified independence over 3 sessions      SLP LONG TERM GOAL #4   Title  pt will name >15 items (average) in simple categories in one minute over three sessions    Time  8    Period  Weeks    Status  New       Plan - 12/16/17 1721    Clinical Impression Statement  Pt presents today with reported cognitive communication deficits  as reported by MoCA administered at [facility]. Formal cog-comm eval not attempted today. SLP to discuss the possibility of cog-comm therapy in addition to speech and language therapy , due to symptoms not unlike those of primary progressive aphasia - either progressive nonfluent or logopenic subtypes,  charactierized by both anomia and s/s verbal apraxia in conversational speech which lead pt to have much hesitation and pausing. In conversation of 5 minutes, pt abandoned utterances (notable by SLP) 9 times. She expresses great frustration with her circumstances and reports she does not talk as much as she used to in the community.  "It's very em- embarassing," pt stated, "to talk like -this." Pt will be provided a quality of life questionnaire sometime in the first 2 therapy sessions to compare scores/responses in the final 1-2 sessions. She would benefit from skilled ST primarily to learn and use compensatory techniques to assist with verbal communication., as well as attempt to improve pt's preserved language function. It is assumed that writing may NOT be a functional compensatory measure, as pt is experiencing written aphasia as well.     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 DYSLIPIDEMIA 03/16/2009  . HYPERTENSION 03/16/2009    [facility] ,[place], CCC-SLP  12/16/2017, 5:47 PM  [facility] [address] [place], [place], [address] Phone: [phone]   Fax:  [phone]  Name: Melinda Kelly MRN: [id number] Date of Birth: 27-Nov-1945

## 2017-12-24 ENCOUNTER — Ambulatory Visit: Payer: PPO

## 2017-12-24 DIAGNOSIS — R4701 Aphasia: Secondary | ICD-10-CM

## 2017-12-24 DIAGNOSIS — R482 Apraxia: Secondary | ICD-10-CM | POA: Diagnosis not present

## 2017-12-24 NOTE — Therapy (Signed)
[facility] [address] [place], [place], [address] Phone: [phone]   Fax:  [phone]  Speech Language Pathology Treatment  Patient Details  Name: Melinda Kelly MRN: [id number] Date of Birth: March 11, 1945 Referring Provider (SLP): Deanna Artis MD   Encounter Date: 12/24/2017  End of Session - 12/24/17 1656    Visit Number  2    Number of Visits  17    Date for SLP Re-Evaluation  03/14/18    SLP Start Time  2    SLP Stop Time   1617    SLP Time Calculation (min)  43 min    Activity Tolerance  Patient tolerated treatment well       Past Medical History:  Diagnosis Date  . Dyslipidemia   . HTN (hypertension)     Past Surgical History:  Procedure Laterality Date  . BREAST LUMPECTOMY    . TANDEM AND OVOID INSERTION      There were no vitals filed for this visit.  Subjective Assessment - 12/24/17 1537    Subjective  Pt began walking back into gym.    Currently in Pain?  No/denies            ADULT SLP TREATMENT - 12/24/17 1537      General Information   Behavior/Cognition  Alert;Cooperative;Pleasant mood      Treatment Provided   Treatment provided  Cognitive-Linquistic      Cognitive-Linquistic Treatment   Treatment focused on  Aphasia;Apraxia    Skilled Treatment  SLP educated pt re: compensatory strategies with visual cues, and pt took notes with rare min A for spelling. Pt hobbies - gardening (less-so now), and birdwatching. SLP worked with pt on divergent naming for flowering plants and for types of bird feed - pt req'd occasional mod A. SLP further worked with pt today Best boy with a topic she enjoys (coffee). Pt talked with hesitancy and min anomia about words associated with coffee. SLP made a visual "web" using words and ideas pt communicated about coffee. Pt with two topics to complete semantic webs for homework - backyard, and [place].      Assessment /  Recommendations / Plan   Plan  Continue with current plan of care      Progression Toward Goals   Progression toward goals  Progressing toward goals       SLP Education - 12/24/17 1656    Education Details  compensatory strategies, semantic "web" for homework    Person(s) Educated  Patient    Methods  Explanation;Demonstration;Verbal cues    Comprehension  Verbalized understanding;Verbal cues required;Returned demonstration;Need further instruction       SLP Short Term Goals - 12/24/17 1658      SLP SHORT TERM GOAL #1   Title  pt to perform apraxia/speech precision HEP of salient words/short phrases with rare min A over 3 sessions    Time  4    Period  Weeks    Status  On-going      SLP SHORT TERM GOAL #2   Title  pt will functionally use speech and language compensations in 8 minutes simple conversation over 2 sessions    Time  4    Period  Weeks    Status  On-going      SLP SHORT TERM GOAL #3   Title  pt will be provided a quality of life questionnaire/assessment to measure pt's speech related quality of life    Time  2    Period  --   sessions   Status  On-going      SLP SHORT TERM GOAL #4   Title  pt will tell SLP of 3 ways to compensate for decr'd spoken or written language skills in pt's functional situations over three sessions with modified independence    Time  4    Period  Weeks    Status  On-going       SLP Long Term Goals - 12/24/17 1658      SLP LONG TERM GOAL #1   Title  in 5 minutes simple conversation, pt will abandon no more than 3 utterances/ideas    Time  6   (six weeks)   Period  Weeks    Status  On-going      SLP LONG TERM GOAL #2   Title  in 10 minutes simple conversation pt will use compensatory strategies successfully to produce speech/language in WFL/WNL manner over three sessions    Time  8    Period  Weeks    Status  On-going      SLP LONG TERM GOAL #3   Title  pt will perform speech precision/verbal apraxia HEP with modified  independence over 3 sessions    Time  8    Period  Weeks    Status  On-going      SLP LONG TERM GOAL #4   Title  pt will name >15 items (average) in simple categories in one minute over three sessions    Time  8    Period  Weeks    Status  On-going       Plan - 12/24/17 1657    Clinical Impression Statement  Pt cont to present today with symptoms not unlike those of primary progressive aphasia - either progressive nonfluent or logopenic subtypes, charactierized by both anomia and s/s verbal apraxia in conversational speech which lead pt to have much hesitation and pausing. She would benefit from skilled ST primarily to learn and use compensatory techniques to assist with verbal communication., as well as attempt to improve pt's preserved language function. It is assumed that writing may NOT be a functional compensatory measure, as pt is experiencing written aphasia as well.     Speech Therapy Frequency  2x / week    Duration  --   8 weeks or 17 sessions   Treatment/Interventions  Compensatory strategies;Patient/family education;Multimodal communcation approach;SLP instruction and feedback;Internal/external aids;Compensatory techniques;Language facilitation    Potential to Achieve Goals  Fair    Potential Considerations  Medical prognosis;Ability to learn/carryover information       Patient will benefit from skilled therapeutic intervention in order to improve the following deficits and impairments:   Verbal apraxia  Aphasia    Problem List Patient Active Problem List   Diagnosis Date Noted  . DYSLIPIDEMIA 03/16/2009  . HYPERTENSION 03/16/2009    Arbie Reisz ,MS, CCC-SLP  12/24/2017, 4:59 PM  [facility] [address] [place], [place], [address] Phone: [phone]   Fax:  [phone]   Name: NATALIJA MAVIS MRN: [id number] Date of Birth: [birth year]

## 2017-12-26 DIAGNOSIS — R4701 Aphasia: Secondary | ICD-10-CM | POA: Diagnosis not present

## 2017-12-26 DIAGNOSIS — I10 Essential (primary) hypertension: Secondary | ICD-10-CM | POA: Diagnosis not present

## 2017-12-26 DIAGNOSIS — R4189 Other symptoms and signs involving cognitive functions and awareness: Secondary | ICD-10-CM | POA: Diagnosis not present

## 2017-12-27 ENCOUNTER — Ambulatory Visit: Payer: PPO

## 2017-12-27 DIAGNOSIS — R482 Apraxia: Secondary | ICD-10-CM

## 2017-12-27 DIAGNOSIS — R4701 Aphasia: Secondary | ICD-10-CM

## 2017-12-27 NOTE — Patient Instructions (Signed)
  Please complete the assigned speech therapy homework prior to your next session and return it to the speech therapist at your next visit.  

## 2017-12-27 NOTE — Therapy (Signed)
[facility] [address] [place], [place], [address] Phone: [phone]   Fax:  [phone]  Speech Language Pathology Treatment  Patient Details  Name: Melinda Kelly MRN: [id number] Date of Birth: 10/06/1945 Referring Provider (SLP): Deanna Artis MD   Encounter Date: 12/27/2017  End of Session - 12/27/17 1608    Visit Number  3    Number of Visits  17    Date for SLP Re-Evaluation  03/14/18    SLP Start Time  24    SLP Stop Time   1400    SLP Time Calculation (min)  42 min    Activity Tolerance  Patient tolerated treatment well       Past Medical History:  Diagnosis Date  . Dyslipidemia   . HTN (hypertension)     Past Surgical History:  Procedure Laterality Date  . BREAST LUMPECTOMY    . TANDEM AND OVOID INSERTION      There were no vitals filed for this visit.         ADULT SLP TREATMENT - 12/27/17 1554      General Information   Behavior/Cognition  Alert;Cooperative;Other (comment);Distractible   appears anxious     Treatment Provided   Treatment provided  Cognitive-Linquistic      Cognitive-Linquistic Treatment   Treatment focused on  Aphasia;Apraxia    Skilled Treatment  Pt req'd cues which room to go to today for ST ("Do we go this way?" - wrong hallway). SLP had pt explain her semantic webs to SLP and pt with frequent (usual) pausing and hesitation throughout her explanation. When her explanations began, pt length of hestitations were greater and reduced as she progressed through her explanations. She shared with SLP that she generated flower and tree names more quickly seeing them as she walked through her backyard garden (one web was "backyard"). SLP modeled compensatory measures by using pt's maps app to generate a restarurant name, as well as using the internet for more information about a controversial city issue (carousel at [employer] [facility]). Pt homework is to make a  semantic web for "carousel".       Assessment / Recommendations / Plan   Plan  Continue with current plan of care      Progression Toward Goals   Progression toward goals  Progressing toward goals       SLP Education - 12/27/17 1608    Education Details  compensatory strategies - internet, maps app, primary progressive aphasia    Person(s) Educated  Patient    Methods  Explanation;Demonstration    Comprehension  Verbalized understanding;Need further instruction       SLP Short Term Goals - 12/27/17 1609      SLP SHORT TERM GOAL #1   Title  pt to perform apraxia/speech precision HEP of salient words/short phrases with rare min A over 3 sessions    Time  4    Period  Weeks    Status  On-going      SLP SHORT TERM GOAL #2   Title  pt will functionally use speech and language compensations in 8 minutes simple conversation over 2 sessions    Time  4    Period  Weeks    Status  On-going      SLP SHORT TERM GOAL #3   Title  pt will be provided a quality of life questionnaire/assessment to measure pt's speech related quality of life    Time  2  Period  --   sessions   Status  On-going      SLP SHORT TERM GOAL #4   Title  pt will tell SLP of 3 ways to compensate for decr'd spoken or written language skills in pt's functional situations over three sessions with modified independence    Time  4    Period  Weeks    Status  On-going       SLP Long Term Goals - 12/27/17 1609      SLP LONG TERM GOAL #1   Title  in 5 minutes simple conversation, pt will abandon no more than 3 utterances/ideas    Time  6   (six weeks)   Period  Weeks    Status  On-going      SLP LONG TERM GOAL #2   Title  in 10 minutes simple conversation pt will use compensatory strategies successfully to produce speech/language in WFL/WNL manner over three sessions    Time  8    Period  Weeks    Status  On-going      SLP LONG TERM GOAL #3   Title  pt will perform speech precision/verbal apraxia HEP  with modified independence over 3 sessions    Time  8    Period  Weeks    Status  On-going      SLP LONG TERM GOAL #4   Title  pt will name >15 items (average) in simple categories in one minute over three sessions    Time  8    Period  Weeks    Status  On-going       Plan - 12/27/17 1608    Clinical Impression Statement  Pt cont to present today with symptoms not unlike those of primary progressive aphasia - either progressive nonfluent or logopenic subtypes, charactierized by both anomia and s/s verbal apraxia in conversational speech which lead pt to have much hesitation and pausing. She would benefit from skilled ST primarily to learn and use compensatory techniques to assist with verbal communication., as well as attempt to improve pt's preserved language function. It is assumed that writing may NOT be a functional compensatory measure, as pt is experiencing written aphasia as well.     Speech Therapy Frequency  2x / week    Duration  --   8 weeks or 17 sessions   Treatment/Interventions  Compensatory strategies;Patient/family education;Multimodal communcation approach;SLP instruction and feedback;Internal/external aids;Compensatory techniques;Language facilitation    Potential to Achieve Goals  Fair    Potential Considerations  Medical prognosis;Ability to learn/carryover information       Patient will benefit from skilled therapeutic intervention in order to improve the following deficits and impairments:   Aphasia  Verbal apraxia    Problem List Patient Active Problem List   Diagnosis Date Noted  . DYSLIPIDEMIA 03/16/2009  . HYPERTENSION 03/16/2009    [place] ,[place], CCC-SLP  12/27/2017, 4:09 PM  [facility] [address] [place], [place], [address] Phone: [phone]   Fax:  [phone]   Name: Melinda Kelly MRN: [id number] Date of Birth: [birth year]

## 2017-12-30 ENCOUNTER — Ambulatory Visit: Payer: PPO | Admitting: Speech Pathology

## 2017-12-30 DIAGNOSIS — R482 Apraxia: Secondary | ICD-10-CM

## 2017-12-30 DIAGNOSIS — R4701 Aphasia: Secondary | ICD-10-CM

## 2017-12-31 NOTE — Therapy (Signed)
[facility] [address] [place], [place], [address] Phone: [phone]   Fax:  [phone]  Speech Language Pathology Treatment  Patient Details  Name: Melinda Kelly MRN: [id number] Date of Birth: [birth year] Referring Provider (SLP): Deanna Artis MD   Encounter Date: 12/30/2017  End of Session - 12/31/17 0914    Visit Number  4    Number of Visits  17    Date for SLP Re-Evaluation  03/14/18    SLP Start Time  1618    SLP Stop Time   1700    SLP Time Calculation (min)  42 min    Activity Tolerance  Patient tolerated treatment well       Past Medical History:  Diagnosis Date  . Dyslipidemia   . HTN (hypertension)     Past Surgical History:  Procedure Laterality Date  . BREAST LUMPECTOMY    . TANDEM AND OVOID INSERTION      There were no vitals filed for this visit.  Subjective Assessment - 12/30/17 1620    Subjective  "Where are we going today?"    Patient is accompained by:  Family member   husband Sonia Side   Currently in Pain?  No/denies            ADULT SLP TREATMENT - 12/30/17 1618      General Information   Behavior/Cognition  Alert;Cooperative;Other (comment);Distractible   distractible     Treatment Provided   Treatment provided  Cognitive-Linquistic      Pain Assessment   Pain Assessment  No/denies pain      Cognitive-Linquistic Treatment   Treatment focused on  Aphasia;Apraxia    Skilled Treatment  Pt arrived with husband, who reports Dr. Laurelyn Sickle suggested he attend speech therapy sessions. SLP agreed this would be beneficial for pt. He questioned how he should "know when to step in" if pt is having difficulty with her speech. Education provided re: supportive conversation strategies and cuing techniques, and demo'd using these in conversation. In simple conversation, pt had several hesistations, phonemic and semantic paraphasias. With extended time, cues to slow her rate,  and occasional question cues, pt retrieved word or an acceptable substitute. Pt did not complete semantic web as assigned; SLP provided education re: semantic networks and word retrieval. SLP had pt generate lists of personally relevant words to be used in future therapy sessions; anomia significantly impacting retrieval of names (friends, family members). Pt to continue working on these lists at home.       Assessment / Recommendations / Plan   Plan  Continue with current plan of care      Progression Toward Goals   Progression toward goals  Progressing toward goals       SLP Education - 12/31/17 0913    Education Details  supportive conversation techniques, cuing strategies    Person(s) Educated  Patient;Spouse    Methods  Explanation;Demonstration    Comprehension  Verbalized understanding;Need further instruction       SLP Short Term Goals - 12/31/17 0916      SLP SHORT TERM GOAL #1   Title  pt to perform apraxia/speech precision HEP of salient words/short phrases with rare min A over 3 sessions    Time  3    Period  Weeks    Status  On-going      SLP SHORT TERM GOAL #2   Title  pt will functionally use speech and language compensations in 8 minutes simple conversation  over 2 sessions    Time  3    Period  Weeks    Status  On-going      SLP SHORT TERM GOAL #3   Title  pt will be provided a quality of life questionnaire/assessment to measure pt's speech related quality of life    Time  1    Period  Weeks    Status  On-going      SLP SHORT TERM GOAL #4   Title  pt will tell SLP of 3 ways to compensate for decr'd spoken or written language skills in pt's functional situations over three sessions with modified independence    Time  3    Period  Weeks    Status  On-going       SLP Long Term Goals - 12/31/17 7371      SLP LONG TERM GOAL #1   Title  in 5 minutes simple conversation, pt will abandon no more than 3 utterances/ideas    Time  5    Status  On-going       SLP LONG TERM GOAL #2   Title  in 10 minutes simple conversation pt will use compensatory strategies successfully to produce speech/language in WFL/WNL manner over three sessions    Time  7    Period  Weeks    Status  On-going      SLP LONG TERM GOAL #3   Title  pt will perform speech precision/verbal apraxia HEP with modified independence over 3 sessions    Time  7    Period  Weeks    Status  On-going      SLP LONG TERM GOAL #4   Title  pt will name >15 items (average) in simple categories in one minute over three sessions    Time  7    Period  Weeks    Status  On-going       Plan - 12/31/17 0914    Clinical Impression Statement  Pt cont to present today with symptoms not unlike those of primary progressive aphasia - either progressive nonfluent or logopenic subtypes, characterized by both anomia and s/s verbal apraxia in conversational speech which lead pt to have much hesitation and pausing. She would benefit from skilled ST primarily to learn and use compensatory techniques to assist with verbal communication, as well as attempt to improve pt's preserved language function. It is assumed that writing may NOT be a functional compensatory measure, as pt is experiencing written aphasia as well. Consider AAC system to supplement pt's verbal communication if expected to deteriorate.     Speech Therapy Frequency  2x / week    Duration  --   8 weeks or 17 sessions   Treatment/Interventions  Compensatory strategies;Patient/family education;Multimodal communcation approach;SLP instruction and feedback;Internal/external aids;Compensatory techniques;Language facilitation    Potential to Achieve Goals  Fair    Potential Considerations  Medical prognosis;Ability to learn/carryover information       Patient will benefit from skilled therapeutic intervention in order to improve the following deficits and impairments:   Aphasia  Verbal apraxia    Problem List Patient Active Problem List    Diagnosis Date Noted  . DYSLIPIDEMIA 03/16/2009  . HYPERTENSION 03/16/2009   Deneise Lever, [place], [place] Speech-Language Pathologist  Aliene Altes 12/31/2017, 9:18 AM  [facility] [address] [place], [place], [address] Phone: [phone]   Fax:  [phone]   Name: EMAGENE MERFELD MRN: [id number] Date  of Birth: 02-17-1945

## 2018-01-06 ENCOUNTER — Ambulatory Visit: Payer: PPO

## 2018-01-06 DIAGNOSIS — R482 Apraxia: Secondary | ICD-10-CM

## 2018-01-06 DIAGNOSIS — R4701 Aphasia: Secondary | ICD-10-CM

## 2018-01-06 NOTE — Therapy (Signed)
[facility] [address] [place], [place], [address] Phone: [phone]   Fax:  [phone]  Speech Language Pathology Treatment  Patient Details  Name: Melinda Kelly MRN: [id number] Date of Birth: 03-20-1945 Referring Provider (SLP): Deanna Artis MD   Encounter Date: 01/06/2018  End of Session - 01/06/18 1430    Visit Number  5    Number of Visits  17    Date for SLP Re-Evaluation  03/14/18    SLP Start Time  1319    SLP Stop Time   1400    SLP Time Calculation (min)  41 min    Activity Tolerance  Patient tolerated treatment well       Past Medical History:  Diagnosis Date  . Dyslipidemia   . HTN (hypertension)     Past Surgical History:  Procedure Laterality Date  . BREAST LUMPECTOMY    . TANDEM AND OVOID INSERTION      There were no vitals filed for this visit.  Subjective Assessment - 01/06/18 1355    Subjective  Pt brought husband into ST room today.    Patient is accompained by:  Family member   Sonia Side- husband   Currently in Pain?  No/denies            ADULT SLP TREATMENT - 01/06/18 1413      General Information   Behavior/Cognition  Alert;Cooperative;Other (comment);Distractible      Treatment Provided   Treatment provided  Cognitive-Linquistic      Cognitive-Linquistic Treatment   Treatment focused on  Aphasia;Apraxia    Skilled Treatment  Pt arrived with husband. SLP agreed this would be beneficial for pt. SLP worked with pt to describe verbally and using compensations (writing, drawing, gesturing, description) to make verbal expression WFL. Pt req'd min-mod cues rarely, and utilized slowed rate of speech in order to incr accuracy of her message. Pt's husband demo'd using some of the strategies he learned last session to assist pt in conversation. SLP educated pt/husband re: taking pictures of places and of dishes pt has at various listed restaurants, as well as people on  her list/s. SLP explained that if/when pt's language changes she may need pictures to assist communication. Additionally, SLP eduated pt/husband that pt may want to rehearse speaking words on her lists as well as hold the item named, write down item named, etc, in order to forge or reinforce communication/linguistic connections       Assessment / Recommendations / [place] with current plan of care      Progression Toward Goals   Progression toward goals  Progressing toward goals       SLP Education - 01/06/18 1429    Education Details  using pertinent lists for practice and for forging and/or maintaining neural linguistic connections    Person(s) Educated  Patient;Spouse    Methods  Explanation;Demonstration;Verbal cues    Comprehension  Verbalized understanding;Returned demonstration;Need further instruction;Verbal cues required       SLP Short Term Goals - 01/06/18 1431      SLP SHORT TERM GOAL #1   Title  pt to perform apraxia/speech precision HEP of salient words/short phrases with rare min A over 3 sessions    Time  2    Period  Weeks    Status  On-going      SLP SHORT TERM GOAL #2   Title  pt will functionally use speech and language compensations in 8 minutes  simple conversation over 2 sessions    Time  2    Period  Weeks    Status  On-going      SLP SHORT TERM GOAL #3   Title  pt will be provided a quality of life questionnaire/assessment to measure pt's speech related quality of life    Time  1    Period  Weeks    Status  Revised      SLP SHORT TERM GOAL #4   Title  pt will tell SLP of 3 ways to compensate for decr'd spoken or written language skills in pt's functional situations over three sessions with modified independence    Time  3    Period  Weeks    Status  On-going       SLP Long Term Goals - 01/06/18 1432      SLP LONG TERM GOAL #1   Title  in 5 minutes simple conversation, pt will abandon no more than 3 utterances/ideas    Time  4     Status  On-going      SLP LONG TERM GOAL #2   Title  in 10 minutes simple conversation pt will use compensatory strategies successfully to produce speech/language in WFL/WNL manner over three sessions    Time  6    Period  Weeks    Status  On-going      SLP LONG TERM GOAL #3   Title  pt will perform speech precision/verbal apraxia HEP with modified independence over 3 sessions    Time  6    Period  Weeks    Status  On-going      SLP LONG TERM GOAL #4   Title  pt will name >15 items (average) in simple categories in one minute over three sessions    Time  6    Period  Weeks    Status  On-going       Plan - 01/06/18 1430    Clinical Impression Statement  Pt cont to present today with symptoms not unlike those of primary progressive aphasia - either progressive nonfluent or logopenic subtypes. SLP worked with pt today forging/maintaining neural linguistic connections. She would cont to benefit from skilled ST primarily to learn and use compensatory techniques to assist with verbal communication, as well as attempt to improve pt's preserved language function. It is assumed that writing may NOT be a functional compensatory measure, as pt is experiencing written aphasia as well. Consider AAC system to supplement pt's verbal communication if expected to deteriorate.     Speech Therapy Frequency  2x / week    Duration  --   8 weeks or 17 sessions   Treatment/Interventions  Compensatory strategies;Patient/family education;Multimodal communcation approach;SLP instruction and feedback;Internal/external aids;Compensatory techniques;Language facilitation    Potential to Achieve Goals  Fair    Potential Considerations  Medical prognosis;Ability to learn/carryover information       Patient will benefit from skilled therapeutic intervention in order to improve the following deficits and impairments:   Aphasia  Verbal apraxia    Problem List Patient Active Problem List   Diagnosis Date Noted   . DYSLIPIDEMIA 03/16/2009  . HYPERTENSION 03/16/2009    [facility] ,[place], [place]  01/06/2018, 2:32 PM  [facility] [address] [place], [place], [address] Phone: [phone]   Fax:  [phone]   Name: Melinda Kelly MRN: [id number] Date of Birth: 04/04/1945

## 2018-01-06 NOTE — Patient Instructions (Signed)
WRite down, hold, spell, say items you have written on your list/s. Take pictures of the people and places you have listed. Take pictures of food dishes you enjoy at the restaurants you frequent.

## 2018-01-09 ENCOUNTER — Ambulatory Visit: Payer: Medicare HMO | Attending: Internal Medicine

## 2018-01-09 DIAGNOSIS — R4701 Aphasia: Secondary | ICD-10-CM | POA: Diagnosis not present

## 2018-01-09 DIAGNOSIS — R482 Apraxia: Secondary | ICD-10-CM

## 2018-01-09 NOTE — Therapy (Signed)
[facility] [address] [place], [place], [address] Phone: [phone]   Fax:  [phone]  Speech Language Pathology Treatment  Patient Details  Name: Melinda Kelly MRN: [id number] Date of Birth: Feb 14, 1945 Referring Provider (SLP): Deanna Artis MD   Encounter Date: 01/09/2018  End of Session - 01/09/18 1701    Visit Number  6    Number of Visits  17    Date for SLP Re-Evaluation  03/14/18    SLP Start Time  7253    SLP Stop Time   1615    SLP Time Calculation (min)  42 min    Activity Tolerance  Patient tolerated treatment well       Past Medical History:  Diagnosis Date  . Dyslipidemia   . HTN (hypertension)     Past Surgical History:  Procedure Laterality Date  . BREAST LUMPECTOMY    . TANDEM AND OVOID INSERTION      There were no vitals filed for this visit.         ADULT SLP TREATMENT - 01/09/18 1539      General Information   Behavior/Cognition  Alert;Cooperative;Other (comment);Distractible      Treatment Provided   Treatment provided  Cognitive-Linquistic      Cognitive-Linquistic Treatment   Treatment focused on  Apraxia;Aphasia    Skilled Treatment  Pt arrived with husband today, with approx 10 pictures taken of pertinent landmarks. SLP encouraged pt to elaborate on these landmarks ([address], [address], [address], [address]) ,and pt did so with usual mod A. With additional practice using semantic elaboration with salient pt items husband demo'd appropriate semantic cuing for pt. Phonemic cues were much less helpful for pt today by husband or by SLP.  SLP educated pt/husband that with persistent words/phrases/concepts pt should practice them specifically by saying 5 sentences (writing down the sentenes as well) with different sentence structure. SLP also encouraged pt to take pictures of these things/concepts PRN for assistance later.       Assessment / Recommendations /  Plan   Plan  Continue with current plan of care      Progression Toward Goals   Progression toward goals  Progressing toward goals       SLP Education - 01/09/18 1700    Education Details  practice difficult words/concepts which are persistent and take pics when possible    Person(s) Educated  Patient;Spouse    Methods  Explanation;Demonstration    Comprehension  Verbalized understanding;Need further instruction       SLP Short Term Goals - 01/09/18 1702      SLP SHORT TERM GOAL #1   Title  pt to perform apraxia/speech precision HEP of salient words/short phrases with rare min A over 3 sessions    Time  2    Period  Weeks    Status  On-going      SLP SHORT TERM GOAL #2   Title  pt will functionally use speech and language compensations in 8 minutes simple conversation over 2 sessions    Time  2    Period  Weeks    Status  On-going      SLP SHORT TERM GOAL #3   Title  pt will be provided a quality of life questionnaire/assessment to measure pt's speech related quality of life    Time  1    Period  Weeks    Status  Revised      SLP SHORT TERM  GOAL #4   Title  pt will tell SLP of 3 ways to compensate for decr'd spoken or written language skills in pt's functional situations over three sessions with modified independence    Time  3    Period  Weeks    Status  On-going       SLP Long Term Goals - 01/09/18 1703      SLP LONG TERM GOAL #1   Title  in 5 minutes simple conversation, pt will abandon no more than 3 utterances/ideas    Time  4    Status  On-going      SLP LONG TERM GOAL #2   Title  in 10 minutes simple conversation pt will use compensatory strategies successfully to produce speech/language in WFL/WNL manner over three sessions    Time  6    Period  Weeks    Status  On-going      SLP LONG TERM GOAL #3   Title  pt will perform speech precision/verbal apraxia HEP with modified independence over 3 sessions    Time  6    Period  Weeks    Status  On-going       SLP LONG TERM GOAL #4   Title  pt will name >15 items (average) in simple categories in one minute over three sessions    Time  6    Period  Weeks    Status  On-going       Plan - 01/09/18 1701    Clinical Impression Statement  Pt cont to present today with symptoms not unlike those of primary progressive aphasia - either progressive nonfluent or logopenic subtypes. SLP cont to work with pt today forging/maintaining neural linguistic connections. She would cont to benefit from skilled ST primarily to learn and use compensatory techniques to assist with verbal communication, as well as attempt to improve pt's preserved language function. It is assumed that writing may NOT be a functional compensatory measure, as pt is experiencing written aphasia as well. Pt is beginning to take pictures to augment spoken language, eventually. SLP encouraged pt today to use pictures as she can in order to incr habitualization. Consider AAC system to supplement pt's verbal communication if expected to deteriorate.     Speech Therapy Frequency  2x / week    Duration  --   8 weeks or 17 sessions   Treatment/Interventions  Compensatory strategies;Patient/family education;Multimodal communcation approach;SLP instruction and feedback;Internal/external aids;Compensatory techniques;Language facilitation    Potential to Achieve Goals  Fair    Potential Considerations  Medical prognosis;Ability to learn/carryover information       Patient will benefit from skilled therapeutic intervention in order to improve the following deficits and impairments:   Aphasia  Verbal apraxia    Problem List Patient Active Problem List   Diagnosis Date Noted  . DYSLIPIDEMIA 03/16/2009  . HYPERTENSION 03/16/2009    [facility] ,MS, CCC-SLP  01/09/2018, 5:03 PM  [facility] [address] [place], [place], [address] Phone: [phone]   Fax:   [phone]   Name: Melinda Kelly MRN: [id number] Date of Birth: 04/08/1945

## 2018-01-09 NOTE — Therapy (Signed)
[facility] [address]  [place], [place], [address] Phone: [phone]   Fax:  [phone]  Speech Language Pathology Treatment  Patient Details  Name: Melinda Kelly MRN: [id number] Date of Birth: 02/21/1945 Referring Provider (SLP): Deanna Artis MD   Encounter Date: 01/09/2018  End of Session - 01/09/18 1701    Visit Number  6    Number of Visits  17    Date for SLP Re-Evaluation  03/14/18    SLP Start Time  1062    SLP Stop Time   1615    SLP Time Calculation (min)  42 min    Activity Tolerance  Patient tolerated treatment well       Past Medical History:  Diagnosis Date  . Dyslipidemia   . HTN (hypertension)     Past Surgical History:  Procedure Laterality Date  . BREAST LUMPECTOMY    . TANDEM AND OVOID INSERTION      There were no vitals filed for this visit.         ADULT SLP TREATMENT - 01/09/18 1539      General Information   Behavior/Cognition  Alert;Cooperative;Other (comment);Distractible      Treatment Provided   Treatment provided  Cognitive-Linquistic      Cognitive-Linquistic Treatment   Treatment focused on  Apraxia;Aphasia    Skilled Treatment  Pt arrived with husband today, with approx 10 pictures taken of pertinent landmarks. SLP encouraged pt to elaborate on these landmarks ([address], [address], [address], [address]) ,and pt did so with usual mod A. With additional practice using semantic elaboration with salient pt items husband demo'd appropriate semantic cuing for pt. Phonemic cues were much less helpful for pt today by husband or by SLP.  SLP educated pt/husband that with persistent words/phrases/concepts pt should practice them specifically by saying 5 sentences (writing down the sentenes as well) with different sentence structure. SLP also encouraged pt to take pictures of these things/concepts PRN for assistance later.       Assessment / Recommendations /  Plan   Plan  Continue with current plan of care      Progression Toward Goals   Progression toward goals  Progressing toward goals       SLP Education - 01/09/18 1700    Education Details  practice difficult words/concepts which are persistent and take pics when possible    Person(s) Educated  Patient;Spouse    Methods  Explanation;Demonstration    Comprehension  Verbalized understanding;Need further instruction       SLP Short Term Goals - 01/09/18 1702      SLP SHORT TERM GOAL #1   Title  pt to perform apraxia/speech precision HEP of salient words/short phrases with rare min A over 3 sessions    Time  2    Period  Weeks    Status  On-going      SLP SHORT TERM GOAL #2   Title  pt will functionally use speech and language compensations in 8 minutes simple conversation over 2 sessions    Time  2    Period  Weeks    Status  On-going      SLP SHORT TERM GOAL #3   Title  pt will be provided a quality of life questionnaire/assessment to measure pt's speech related quality of life    Time  1    Period  Weeks    Status  Revised      SLP SHORT TERM  GOAL #4   Title  pt will tell SLP of 3 ways to compensate for decr'd spoken or written language skills in pt's functional situations over three sessions with modified independence    Time  3    Period  Weeks    Status  On-going       SLP Long Term Goals - 01/09/18 1703      SLP LONG TERM GOAL #1   Title  in 5 minutes simple conversation, pt will abandon no more than 3 utterances/ideas    Time  4    Status  On-going      SLP LONG TERM GOAL #2   Title  in 10 minutes simple conversation pt will use compensatory strategies successfully to produce speech/language in WFL/WNL manner over three sessions    Time  6    Period  Weeks    Status  On-going      SLP LONG TERM GOAL #3   Title  pt will perform speech precision/verbal apraxia HEP with modified independence over 3 sessions    Time  6    Period  Weeks    Status  On-going       SLP LONG TERM GOAL #4   Title  pt will name >15 items (average) in simple categories in one minute over three sessions    Time  6    Period  Weeks    Status  On-going       Plan - 01/09/18 1701    Clinical Impression Statement  Pt cont to present today with symptoms not unlike those of primary progressive aphasia - either progressive nonfluent or logopenic subtypes. SLP cont to work with pt today forging/maintaining neural linguistic connections. She would cont to benefit from skilled ST primarily to learn and use compensatory techniques to assist with verbal communication, as well as attempt to improve pt's preserved language function. It is assumed that writing may NOT be a functional compensatory measure, as pt is experiencing written aphasia as well. Pt is beginning to take pictures to augment spoken language, eventually. SLP encouraged pt today to use pictures as she can in order to incr habitualization. Consider AAC system to supplement pt's verbal communication if expected to deteriorate.     Speech Therapy Frequency  2x / week    Duration  --   8 weeks or 17 sessions   Treatment/Interventions  Compensatory strategies;Patient/family education;Multimodal communcation approach;SLP instruction and feedback;Internal/external aids;Compensatory techniques;Language facilitation    Potential to Achieve Goals  Fair    Potential Considerations  Medical prognosis;Ability to learn/carryover information       Patient will benefit from skilled therapeutic intervention in order to improve the following deficits and impairments:   Aphasia  Verbal apraxia    Problem List Patient Active Problem List   Diagnosis Date Noted  . DYSLIPIDEMIA 03/16/2009  . HYPERTENSION 03/16/2009    [facility] ,[address], CCC-SLP  01/09/2018, 5:04 PM  [facility] [address] [place], [place], [address] Phone: [phone]   Fax:   [phone]   Name: Melinda Kelly MRN: [id number] Date of Birth: [birth year]

## 2018-01-14 ENCOUNTER — Ambulatory Visit: Payer: Medicare HMO

## 2018-01-14 DIAGNOSIS — R482 Apraxia: Secondary | ICD-10-CM | POA: Diagnosis not present

## 2018-01-14 DIAGNOSIS — R4701 Aphasia: Secondary | ICD-10-CM | POA: Diagnosis not present

## 2018-01-14 NOTE — Therapy (Signed)
[facility] [address] [place], [place], [address] Phone: [phone]   Fax:  [phone]  Speech Language Pathology Treatment  Patient Details  Name: Melinda Kelly MRN: [id number] Date of Birth: 04/16/1945 Referring Provider (SLP): Deanna Artis MD   Encounter Date: 01/14/2018  End of Session - 01/14/18 1630    Visit Number  7    Number of Visits  17    Date for SLP Re-Evaluation  03/14/18    SLP Start Time  1151    SLP Stop Time   33    SLP Time Calculation (min)  40 min    Activity Tolerance  Patient tolerated treatment well       Past Medical History:  Diagnosis Date  . Dyslipidemia   . HTN (hypertension)     Past Surgical History:  Procedure Laterality Date  . BREAST LUMPECTOMY    . TANDEM AND OVOID INSERTION      There were no vitals filed for this visit.  Subjective Assessment - 01/14/18 1201    Subjective  Pt brought husband into ST room today. Homework provided for SLP, completed.    Patient is accompained by:  Family member   husband   Currently in Pain?  Yes    Pain Score  5     Pain Location  Knee   shin   Pain Orientation  Right    Pain Descriptors / Indicators  Shooting    Pain Onset  More than a month ago    Pain Frequency  Constant    Aggravating Factors   unknown    Pain Relieving Factors  cortizone shot 12-08-17, medication, stretch/yoga    Effect of Pain on Melinda Activities  wakes pt up occasionally            ADULT SLP TREATMENT - 01/14/18 1207      General Information   Behavior/Cognition  Alert;Cooperative;Pleasant mood;Doesn't follow directions   need to repeat directions occasionally     Treatment Provided   Treatment provided  Cognitive-Linquistic      Cognitive-Linquistic Treatment   Treatment focused on  Aphasia;Apraxia         SLP Short Term Goals - 01/14/18 1632      SLP SHORT TERM GOAL #1   Title  pt to perform apraxia/speech precision  HEP of salient words/short phrases with rare min A over 3 sessions    Time  1    Period  Weeks    Status  On-going      SLP SHORT TERM GOAL #2   Title  pt will functionally use speech and language compensations in 8 minutes simple conversation over 2 sessions    Time  1    Period  Weeks    Status  On-going      SLP SHORT TERM GOAL #3   Title  pt will be provided a quality of life questionnaire/assessment to measure pt's speech related quality of life    Status  Deferred      SLP SHORT TERM GOAL #4   Title  pt will tell SLP of 3 ways to compensate for decr'd spoken or written language skills in pt's functional situations over three sessions with modified independence    Time  2    Period  Weeks    Status  On-going       SLP Long Term Goals - 01/14/18 1634      SLP LONG TERM GOAL #1  Title  in 5 minutes simple conversation, pt will abandon no more than 3 utterances/ideas    Time  3    Status  On-going      SLP LONG TERM GOAL #2   Title  in 10 minutes simple conversation pt will use compensatory strategies successfully to produce speech/language in WFL/WNL manner over three sessions    Time  5    Period  Weeks    Status  On-going      SLP LONG TERM GOAL #3   Title  pt will perform speech precision/verbal apraxia HEP with modified independence over 3 sessions    Time  5    Period  Weeks    Status  On-going      SLP LONG TERM GOAL #4   Title  pt will name >15 items (average) in simple categories in one minute over three sessions    Time  5    Period  Weeks    Status  On-going       Plan - 01/14/18 1630    Clinical Impression Statement  Pt cont to present today with symptoms not unlike those of either progressive nonfluent or logopenic subtypes of primary progressive aphasia. SLP cont to work with pt today forging/maintaining neural linguistic connections. Today's focus was compensations for communication breakdown due to anomia/apraxia. She would cont to benefit from  skilled ST primarily to learn and use compensatory techniques to assist with verbal communication, as well as attempt to improve pt's preserved language function. SLP will suggest augmentative/alternative device in the future, if clinically appropriate.     Speech Therapy Frequency  2x / week    Duration  --   8 weeks or 17 sessions   Treatment/Interventions  Compensatory strategies;Patient/family education;Multimodal communcation approach;SLP instruction and feedback;Internal/external aids;Compensatory techniques;Language facilitation    Potential to Achieve Goals  Fair    Potential Considerations  Medical prognosis;Ability to learn/carryover information       Patient will benefit from skilled therapeutic intervention in order to improve the following deficits and impairments:   Aphasia  Verbal apraxia    Problem List Patient Active Problem List   Diagnosis Date Noted  . DYSLIPIDEMIA 03/16/2009  . HYPERTENSION 03/16/2009    [facility] ,[place], [place]  01/14/2018, 4:35 PM  [facility] [address] [place], [place], [address] Phone: [phone]   Fax:  [phone]   Name: VERLENE GLANTZ MRN: [id number] Date of Birth: 12-May-1945

## 2018-01-14 NOTE — Patient Instructions (Signed)
   For each target word (#s 1-6):  Take at least 2 words that you wrote, and make a sentence with the target word - do this 5 times for each target word

## 2018-01-16 ENCOUNTER — Ambulatory Visit: Payer: Medicare HMO

## 2018-01-16 DIAGNOSIS — R482 Apraxia: Secondary | ICD-10-CM

## 2018-01-16 DIAGNOSIS — R4701 Aphasia: Secondary | ICD-10-CM

## 2018-01-16 NOTE — Therapy (Signed)
[facility] [address] [place], [place], [address] Phone: [phone]   Fax:  [phone]  Speech Language Pathology Treatment  Patient Details  Name: MILKA WINDHOLZ MRN: [id number] Date of Birth: September 22, 1945 Referring Provider (SLP): Deanna Artis MD   Encounter Date: 01/16/2018  End of Session - 01/16/18 1552    Visit Number  8    Number of Visits  17    Date for SLP Re-Evaluation  03/14/18    SLP Start Time  1449    SLP Stop Time   1530    SLP Time Calculation (min)  41 min    Activity Tolerance  Patient tolerated treatment well       Past Medical History:  Diagnosis Date  . Dyslipidemia   . HTN (hypertension)     Past Surgical History:  Procedure Laterality Date  . BREAST LUMPECTOMY    . TANDEM AND OVOID INSERTION      There were no vitals filed for this visit.  Subjective Assessment - 01/16/18 1453    Subjective  "Sonia Side can't come - he - has a cold."    Currently in Pain?  Yes    Pain Score  3     Pain Location  Knee    Pain Orientation  Right    Pain Descriptors / Indicators  Aching    Pain Type  Chronic pain    Pain Onset  More than a month ago    Pain Frequency  Constant            ADULT SLP TREATMENT - 01/16/18 1454      General Information   Behavior/Cognition  Alert;Cooperative;Pleasant mood;Doesn't follow directions      Treatment Provided   Treatment provided  Cognitive-Linquistic      Cognitive-Linquistic Treatment   Treatment focused on  Aphasia;Apraxia    Skilled Treatment  Pt told SLP 4 compensations for anomia/apraxia today when verbal communication isn't possible and pt needs to communicate, with min-mod A rarely. SLP educated pt re: drawing app on her iPhone, pt to downoad later, as her husband downloads all apps for her. SLP looked at pt's homework and worked with her re: some of her semantically connected sentences that did not make sense or that wouldn't  happen. Pt req'd mod A occasionally.       Assessment / Recommendations / Plan   Plan  Continue with current plan of care      Progression Toward Goals   Progression toward goals  Progressing toward goals         SLP Short Term Goals - 01/16/18 1554      SLP SHORT TERM GOAL #1   Title  pt to perform apraxia/speech precision HEP of salient words/short phrases with rare min A over 3 sessions    Status  Deferred   deferred to pt preference of "word puzzles and games" (pt)     SLP SHORT TERM GOAL #2   Title  pt will functionally use speech and language compensations in 8 minutes simple conversation over 2 sessions    Status  Partially Met   3 minutes with min questioning cues     SLP SHORT TERM GOAL #3   Title  pt will be provided a quality of life questionnaire/assessment to measure pt's speech related quality of life    Status  Deferred      SLP SHORT TERM GOAL #4   Title  pt will tell SLP  of 3 ways to compensate for decr'd spoken or written language skills in pt's functional situations over three sessions with modified independence    Status  Partially Met   1/3 sessions      SLP Long Term Goals - 01/16/18 1555      SLP LONG TERM GOAL #1   Title  in 5 minutes simple conversation, pt will abandon no more than 3 utterances/ideas    Time  3    Status  On-going      SLP LONG TERM GOAL #2   Title  in 8 minutes simple conversation pt will use compensatory strategies successfully to produce speech/language in WFL/WNL manner over three sessions    Time  5    Period  Weeks    Status  Revised      SLP LONG TERM GOAL #3   Title  pt will perform speech precision/verbal apraxia HEP with modified independence over 3 sessions    Time  5    Period  Weeks    Status  On-going      SLP LONG TERM GOAL #4   Title  pt will name >12 items (average) in simple categories in one minute over three sessions    Time  5    Period  Weeks    Status  Revised       Plan - 01/16/18 1553     Clinical Impression Statement  Pt cont to present today with symptoms not unlike those of either progressive nonfluent or logopenic subtypes of primary progressive aphasia. SLP cont to work with pt today forging/maintaining neural linguistic connections. Today's focus was on both compensations for communication breakdown due to anomia/apraxia, and fostering/building semantic connections between words. She would cont to benefit from skilled ST primarily to learn and use compensatory techniques to assist with verbal communication, as well as attempt to improve pt's preserved language function. SLP will suggest augmentative/alternative device in the future, if clinically appropriate.     Speech Therapy Frequency  2x / week    Duration  --   8 weeks or 17 sessions   Treatment/Interventions  Compensatory strategies;Patient/family education;Multimodal communcation approach;SLP instruction and feedback;Internal/external aids;Compensatory techniques;Language facilitation    Potential to Achieve Goals  Fair    Potential Considerations  Medical prognosis;Ability to learn/carryover information       Patient will benefit from skilled therapeutic intervention in order to improve the following deficits and impairments:   Aphasia  Verbal apraxia    Problem List Patient Active Problem List   Diagnosis Date Noted  . DYSLIPIDEMIA 03/16/2009  . HYPERTENSION 03/16/2009    [facility] ,[place], CCC-SLP.  01/16/2018, 3:56 PM  [facility] [address] [place], [place], [address] Phone: [phone]   Fax:  [phone]   Name: DENNIS KILLILEA MRN: [id number] Date of Birth: 1945/10/23

## 2018-01-16 NOTE — Patient Instructions (Signed)
  Please complete the assigned speech therapy homework prior to your next session and return it to the speech therapist at your next visit.  

## 2018-01-20 ENCOUNTER — Ambulatory Visit: Payer: Medicare HMO

## 2018-01-22 ENCOUNTER — Ambulatory Visit: Payer: Medicare HMO

## 2018-01-24 ENCOUNTER — Ambulatory Visit: Payer: Medicare HMO

## 2018-01-27 ENCOUNTER — Ambulatory Visit: Payer: Medicare HMO

## 2018-01-27 DIAGNOSIS — R482 Apraxia: Secondary | ICD-10-CM | POA: Diagnosis not present

## 2018-01-27 DIAGNOSIS — R4701 Aphasia: Secondary | ICD-10-CM | POA: Diagnosis not present

## 2018-01-28 DIAGNOSIS — G3184 Mild cognitive impairment, so stated: Secondary | ICD-10-CM | POA: Diagnosis not present

## 2018-01-28 NOTE — Therapy (Signed)
[facility] [address] [place], [place], [address] Phone: [phone]   Fax:  [phone]  Speech Language Pathology Treatment  Patient Details  Name: Melinda Kelly MRN: [id number] Date of Birth: 1945-10-21 Referring Provider (SLP): Deanna Artis MD   Encounter Date: 01/27/2018  End of Session - 01/28/18 1237    Visit Number  9    Number of Visits  17    Date for SLP Re-Evaluation  03/14/18    SLP Start Time  0017    SLP Stop Time   1446    SLP Time Calculation (min)  41 min       Past Medical History:  Diagnosis Date  . Dyslipidemia   . HTN (hypertension)     Past Surgical History:  Procedure Laterality Date  . BREAST LUMPECTOMY    . TANDEM AND OVOID INSERTION      There were no vitals filed for this visit.  Subjective Assessment - 01/27/18 1410    Subjective  "I don't- know if - - it's affecting how I talk." (re: homework)    Currently in Pain?  Yes    Pain Score  8     Pain Location  Leg    Pain Orientation  Right    Pain Descriptors / Indicators  Aching    Pain Type  Chronic pain    Pain Onset  More than a month ago    Pain Frequency  Constant    Aggravating Factors   not exercising    Pain Relieving Factors  exercise, yoga            ADULT SLP TREATMENT - 01/28/18 0001      General Information   Behavior/Cognition  Alert;Cooperative;Pleasant mood;Doesn't follow directions      Treatment Provided   Treatment provided  Cognitive-Linquistic      Cognitive-Linquistic Treatment   Treatment focused on  Aphasia;Apraxia    Skilled Treatment  Compensations for situational modifications for pt's benefit - no more than 1-2 conversational partners, quiet location, extra time makes communication easier for pt. Pt used drawing as compensation today during divergent naming tasks. Pt named average 8 items in simple category prior to requiring SLP mod cues. SLP introduced pt to speech  generating device (SGD) to inquire whether pt desired to begin training with this type of device. She declined at this time, despite SLP telling pt that she would benefit from this. Pt was open to hearing that she might require something like this device in the future to communicate functionally. Pt complained about semantic connections homework assignements and so SLP had pt focus on rate reduction whenever talking.       Assessment / Recommendations / Plan   Plan  Continue with current plan of care      Progression Toward Goals   Progression toward goals  Not progressing toward goals (comment)   severity      SLP Education - 01/28/18 1236    Education Details  compensations for aphasia, speech generating device    Person(s) Educated  Patient    Methods  Explanation    Comprehension  Verbalized understanding       SLP Short Term Goals - 01/16/18 1554      SLP SHORT TERM GOAL #1   Title  pt to perform apraxia/speech precision HEP of salient words/short phrases with rare min A over 3 sessions    Status  Deferred   deferred to pt preference  of "word puzzles and games" (pt)     SLP SHORT TERM GOAL #2   Title  pt will functionally use speech and language compensations in 8 minutes simple conversation over 2 sessions    Status  Partially Met   3 minutes with min questioning cues     SLP SHORT TERM GOAL #3   Title  pt will be provided a quality of life questionnaire/assessment to measure pt's speech related quality of life    Status  Deferred      SLP SHORT TERM GOAL #4   Title  pt will tell SLP of 3 ways to compensate for decr'd spoken or written language skills in pt's functional situations over three sessions with modified independence    Status  Partially Met   1/3 sessions      SLP Long Term Goals - 01/28/18 1238      SLP LONG TERM GOAL #1   Title  in 5 minutes simple conversation, pt will abandon no more than 3 utterances/ideas    Time  2    Status  On-going      SLP  LONG TERM GOAL #2   Title  in 8 minutes simple conversation pt will use compensatory strategies successfully to produce speech/language in WFL/WNL manner over three sessions    Time  4    Period  Weeks    Status  Revised      SLP LONG TERM GOAL #3   Title  pt will perform speech precision/verbal apraxia HEP with modified independence over 3 sessions    Time  4    Period  Weeks    Status  On-going      SLP LONG TERM GOAL #4   Title  pt will name >12 items (average) in simple categories in one minute over three sessions    Time  4    Period  Weeks    Status  Revised       Plan - 01/28/18 1237    Clinical Impression Statement  Pt cont to present today with symptoms not unlike those of either progressive nonfluent or logopenic subtypes of primary progressive aphasia. SLP introduced pt to speech generating device, asking her if she desired to work with this type of device at this time and she declined. SLP cont to work with pt today forging/maintaining neural linguistic connections. Today's focus was on both compensations for communication breakdown due to anomia/apraxia, and fostering/building semantic connections between words. She would cont to benefit from skilled ST primarily to learn and use compensatory techniques to assist with verbal communication, as well as attempt to improve pt's preserved language function. SLP will suggest augmentative/alternative device in the future, if clinically appropriate.     Speech Therapy Frequency  2x / week    Duration  --   8 weeks or 17 sessions   Treatment/Interventions  Compensatory strategies;Patient/family education;Multimodal communcation approach;SLP instruction and feedback;Internal/external aids;Compensatory techniques;Language facilitation    Potential to Achieve Goals  Fair    Potential Considerations  Medical prognosis;Ability to learn/carryover information       Patient will benefit from skilled therapeutic intervention in order to  improve the following deficits and impairments:   Aphasia  Verbal apraxia    Problem List Patient Active Problem List   Diagnosis Date Noted  . DYSLIPIDEMIA 03/16/2009  . HYPERTENSION 03/16/2009    Kaislee Chao ,MS, [place]  01/28/2018, 12:39 PM  [facility] [address] [place], [place],  [address] Phone: [phone]   Fax:  [phone]   Name: Melinda Kelly MRN: [id number] Date of Birth: 20-Mar-1945

## 2018-01-30 ENCOUNTER — Ambulatory Visit: Payer: Medicare HMO

## 2018-01-30 DIAGNOSIS — R482 Apraxia: Secondary | ICD-10-CM

## 2018-01-30 DIAGNOSIS — R4701 Aphasia: Secondary | ICD-10-CM

## 2018-01-30 NOTE — Therapy (Signed)
[facility] [address] [place], [place], [address] Phone: [phone]   Fax:  [phone]  Speech Language Pathology Treatment  Patient Details  Name: Melinda Kelly MRN: [id number] Date of Birth: 1945-03-20 Referring Provider (SLP): Deanna Artis MD   Encounter Date: 01/30/2018  End of Session - 01/30/18 1714    Visit Number  10    Number of Visits  17    Date for SLP Re-Evaluation  03/14/18    SLP Start Time  4944    SLP Stop Time   1615    SLP Time Calculation (min)  40 min    Activity Tolerance  Patient tolerated treatment well       Past Medical History:  Diagnosis Date  . Dyslipidemia   . HTN (hypertension)     Past Surgical History:  Procedure Laterality Date  . BREAST LUMPECTOMY    . TANDEM AND OVOID INSERTION      There were no vitals filed for this visit.  Subjective Assessment - 01/30/18 1544    Subjective  "I went to [facility] to see - the - other one there and I have this." (pt showed SLP after visit summary telling [facility] (non-amnestic, with neurodegenerative diesease process still possible)    Currently in Pain?  Yes    Pain Score  8     Pain Location  Leg    Pain Orientation  Right    Pain Descriptors / Indicators  Aching    Pain Type  Chronic pain    Pain Onset  More than a month ago    Pain Frequency  Constant    Aggravating Factors   not exercising    Pain Relieving Factors  exercise, yoga            ADULT SLP TREATMENT - 01/30/18 1546      General Information   Behavior/Cognition  Alert;Cooperative;Pleasant mood;Requires cueing      Treatment Provided   Treatment provided  Cognitive-Linquistic      Cognitive-Linquistic Treatment   Treatment focused on  Aphasia;Apraxia    Skilled Treatment  Pt told SLP talking was generally better since the last session due to pt greater focus on rate reduction. "I've decided to have my good friends over -- one at a time." (re:  discussion previous session about limiting conversational partners andother environmental modifications). Pt generated 12 items in a simple category in 4 minutes and mod-max cues occasionally. She used compensatory measures of description and circumlocution in simple conversation, which was mostly comprehended by SLP without need for repetition/questioning.       Assessment / Recommendations / Plan   Plan  Continue with current plan of care      Progression Toward Goals   Progression toward goals  Progressing toward goals   using more compensations, adn more frequently        SLP Short Term Goals - 01/16/18 1554      SLP SHORT TERM GOAL #1   Title  pt to perform apraxia/speech precision HEP of salient words/short phrases with rare min A over 3 sessions    Status  Deferred   deferred to pt preference of "word puzzles and games" (pt)     SLP SHORT TERM GOAL #2   Title  pt will functionally use speech and language compensations in 8 minutes simple conversation over 2 sessions    Status  Partially Met   3 minutes with min questioning cues  SLP SHORT TERM GOAL #3   Title  pt will be provided a quality of life questionnaire/assessment to measure pt's speech related quality of life    Status  Deferred      SLP SHORT TERM GOAL #4   Title  pt will tell SLP of 3 ways to compensate for decr'd spoken or written language skills in pt's functional situations over three sessions with modified independence    Status  Partially Met   1/3 sessions      SLP Long Term Goals - 01/30/18 1548      SLP LONG TERM GOAL #1   Title  in 5 minutes simple conversation, pt will abandon no more than 3 utterances/ideas    Status  Achieved      SLP LONG TERM GOAL #2   Title  in 8 minutes simple conversation pt will use compensatory strategies successfully to produce speech/language in WFL/WNL manner over three sessions    Time  4    Period  Weeks    Status  Revised      SLP LONG TERM GOAL #3   Title   pt will perform speech precision/verbal apraxia HEP with modified independence over 3 sessions    Time  4    Period  Weeks    Status  On-going      SLP LONG TERM GOAL #4   Title  pt will name >12 items (average) in simple categories in one minute over three sessions    Time  4    Period  Weeks    Status  Revised       Plan - 01/30/18 1714    Clinical Impression Statement  Pt cont to present today with symptoms not unlike those of either progressive nonfluent or logopenic subtypes of primary progressive aphasia. SLP cont to work with pt today forging/maintaining neural linguistic connections as well as use of compensations for her verbal expression. She would cont to benefit from skilled ST primarily to learn and use compensatory techniques to assist with verbal communication, as well as attempt to improve pt's preserved language function. SLP will suggest augmentative/alternative device in the future, if clinically appropriate.     Speech Therapy Frequency  2x / week    Duration  --   8 weeks or 17 sessions   Treatment/Interventions  Compensatory strategies;Patient/family education;Multimodal communcation approach;SLP instruction and feedback;Internal/external aids;Compensatory techniques;Language facilitation    Potential to Achieve Goals  Fair    Potential Considerations  Medical prognosis;Ability to learn/carryover information       Patient will benefit from skilled therapeutic intervention in order to improve the following deficits and impairments:   Aphasia  Verbal apraxia   Speech Therapy Progress Note  Dates of Reporting Period: 12-16-17 to 01-30-18  Subjective: Pt has been seen for 10 visits of ST targeting maintaining linguistic function as well as using compensations for verbal expression  Objective Measurements: Pt reports she is using wider variety fo compensation and doing so more frequently.  Goal Update: See above.  Plan: Cont to see pt until POC ends (visit  17)  Reason Skilled Services are Required: Pt has not yet reached full potential to use compensatory strategies at her disposal.   Problem List Patient Active Problem List   Diagnosis Date Noted  . DYSLIPIDEMIA 03/16/2009  . HYPERTENSION 03/16/2009    Shadonna Benedick ,MS, CCC-SLP  01/30/2018, 5:16 PM  [facility] [address] [place], [place], [address] Phone: [phone]  Fax:  [phone]   Name: Melinda Kelly MRN: [id number] Date of Birth: 01-Aug-1945

## 2018-02-03 ENCOUNTER — Ambulatory Visit: Payer: Medicare HMO

## 2018-02-03 DIAGNOSIS — R482 Apraxia: Secondary | ICD-10-CM

## 2018-02-03 DIAGNOSIS — R4701 Aphasia: Secondary | ICD-10-CM

## 2018-02-04 NOTE — Patient Instructions (Signed)
[facility] THERAPY - use the username you chose, and the password you chose

## 2018-02-04 NOTE — Therapy (Signed)
[facility] [address] [place] [facility] [place], [place], [address] Phone: [phone]   Fax:  [phone]  Speech Language Pathology Treatment  Patient Details  Name: Melinda Kelly MRN: [id number] Date of Birth: 15-Apr-1945 Referring Provider (SLP): Deanna Artis MD   Encounter Date: 02/03/2018  End of Session - 02/04/18 2314    Visit Number  11    Number of Visits  17    Date for SLP Re-Evaluation  03/14/18    SLP Start Time  1533    SLP Stop Time   1615    SLP Time Calculation (min)  42 min    Activity Tolerance  Patient tolerated treatment well       Past Medical History:  Diagnosis Date  . Dyslipidemia   . HTN (hypertension)     Past Surgical History:  Procedure Laterality Date  . BREAST LUMPECTOMY    . TANDEM AND OVOID INSERTION      There were no vitals filed for this visit.         ADULT SLP TREATMENT - 02/04/18 0001      General Information   Behavior/Cognition  Alert;Cooperative;Pleasant mood;Requires cueing   appears anxious at times     Treatment Provided   Treatment provided  Cognitive-Linquistic      Cognitive-Linquistic Treatment   Treatment focused on  Apraxia;Aphasia    Skilled Treatment  Pt told SLP salient words and pt made 3 sentences out of each - with mod cues occasionally for spelling. SLP again talked with pt about [employer] and pt not interested at this time. SLP collaborated with pt in setting herself up an account at [employer] and SLP educated pt how to do 2-3 modules. SLP suggested pt write down her username and password and pt did so. SLP provided pt with letter fill ins.       Assessment / Recommendations / Plan   Plan  Continue with current plan of care      Progression Toward Goals   Progression toward goals  Progressing toward goals       SLP Education - 02/04/18 2313    Education Details  talkpath therapy site    Person(s) Educated  Patient    Methods   Explanation;Demonstration;Verbal cues    Comprehension  Verbalized understanding;Returned demonstration;Verbal cues required;Need further instruction       SLP Short Term Goals - 01/16/18 1554      SLP SHORT TERM GOAL #1   Title  pt to perform apraxia/speech precision HEP of salient words/short phrases with rare min A over 3 sessions    Status  Deferred   deferred to pt preference of "word puzzles and games" (pt)     SLP SHORT TERM GOAL #2   Title  pt will functionally use speech and language compensations in 8 minutes simple conversation over 2 sessions    Status  Partially Met   3 minutes with min questioning cues     SLP SHORT TERM GOAL #3   Title  pt will be provided a quality of life questionnaire/assessment to measure pt's speech related quality of life    Status  Deferred      SLP SHORT TERM GOAL #4   Title  pt will tell SLP of 3 ways to compensate for decr'd spoken or written language skills in pt's functional situations over three sessions with modified independence    Status  Partially Met   1/3 sessions  SLP Long Term Goals - 02/04/18 2315      SLP LONG TERM GOAL #1   Title  in 5 minutes simple conversation, pt will abandon no more than 3 utterances/ideas    Status  Achieved      SLP LONG TERM GOAL #2   Title  in 8 minutes simple conversation pt will use compensatory strategies successfully to produce speech/language in WFL/WNL manner over three sessions    Time  3    Period  Weeks    Status  Revised      SLP LONG TERM GOAL #3   Title  pt will perform speech precision/verbal apraxia HEP with modified independence over 3 sessions    Time  3    Period  Weeks    Status  On-going      SLP LONG TERM GOAL #4   Title  pt will name >12 items (average) in simple categories in one minute over three sessions    Time  3    Period  Weeks    Status  Revised       Plan - 02/04/18 2314    Clinical Impression Statement  Pt cont to present today with symptoms not  unlike those of either progressive nonfluent or logopenic subtypes of primary progressive aphasia. SLP cont to work with pt today forging/maintaining neural linguistic connections as well as use of compensations for her verbal expression. Today she was introduced to [employer] website to practice her speech and language. She would cont to benefit from skilled ST primarily to learn and use compensatory techniques to assist with verbal communication, as well as attempt to improve pt's preserved language function. SLP will suggest augmentative/alternative device in the future, if clinically appropriate.     Speech Therapy Frequency  2x / week    Duration  --   8 weeks or 17 sessions   Treatment/Interventions  Compensatory strategies;Patient/family education;Multimodal communcation approach;SLP instruction and feedback;Internal/external aids;Compensatory techniques;Language facilitation    Potential to Achieve Goals  Fair    Potential Considerations  Medical prognosis;Ability to learn/carryover information       Patient will benefit from skilled therapeutic intervention in order to improve the following deficits and impairments:   Aphasia  Verbal apraxia    Problem List Patient Active Problem List   Diagnosis Date Noted  . DYSLIPIDEMIA 03/16/2009  . HYPERTENSION 03/16/2009    SCHINKE,CARL ,MS, [place]  02/04/2018, 11:15 PM  [facility] [address] [place], [place], [address] Phone: [phone]   Fax:  [phone]   Name: Melinda Kelly MRN: [id number] Date of Birth: Mar 26, 1945

## 2018-02-05 ENCOUNTER — Ambulatory Visit: Payer: Medicare HMO

## 2018-02-05 DIAGNOSIS — R482 Apraxia: Secondary | ICD-10-CM | POA: Diagnosis not present

## 2018-02-05 DIAGNOSIS — R4701 Aphasia: Secondary | ICD-10-CM

## 2018-02-05 NOTE — Patient Instructions (Signed)
  Please complete the assigned speech therapy homework prior to your next session and return it to the speech therapist at your next visit.  

## 2018-02-05 NOTE — Therapy (Signed)
[facility] [address] [place], [place], [address] Phone: [phone]   Fax:  [phone]  Speech Language Pathology Treatment  Patient Details  Name: Melinda Kelly MRN: [id number] Date of Birth: [birth year] Referring Provider (SLP): Deanna Artis MD   Encounter Date: 02/05/2018  End of Session - 02/05/18 1714    Visit Number  12    Number of Visits  17    Date for SLP Re-Evaluation  03/14/18    SLP Start Time  1406    SLP Stop Time   1446    SLP Time Calculation (min)  40 min    Activity Tolerance  Patient tolerated treatment well       Past Medical History:  Diagnosis Date  . Dyslipidemia   . HTN (hypertension)     Past Surgical History:  Procedure Laterality Date  . BREAST LUMPECTOMY    . TANDEM AND OVOID INSERTION      There were no vitals filed for this visit.  Subjective Assessment - 02/05/18 1710    Subjective  Pt provided homework immediately for SLP.     Currently in Pain?  No/denies            ADULT SLP TREATMENT - 02/05/18 1710      General Information   Behavior/Cognition  Alert;Cooperative;Pleasant mood;Requires cueing      Treatment Provided   Treatment provided  Cognitive-Linquistic      Cognitive-Linquistic Treatment   Treatment focused on  Aphasia;Apraxia    Skilled Treatment  Pt arrived questioning geting [employer] Therapy on her iPhone. SLP suggested on her iPad at home instead and pt asked to see this so SLP retrieved clinic iPad and assisted pt with tasks on the iPad. Pt stated she could perform tasks on her iPad at home. Pt's greatest difficulty came with picture/object description task, much like semantic feature analysis.      Assessment / Recommendations / Plan   Plan  Continue with current plan of care      Progression Toward Goals   Progression toward goals  Progressing toward goals       SLP Education - 02/05/18 1713    Education Details  talkpath  therapy on iPad, pt's husband should CUE pt and not primarily provide her with answers for homework    Person(s) Educated  Patient    Methods  Explanation;Demonstration    Comprehension  Verbalized understanding       SLP Short Term Goals - 01/16/18 1554      SLP SHORT TERM GOAL #1   Title  pt to perform apraxia/speech precision HEP of salient words/short phrases with rare min A over 3 sessions    Status  Deferred   deferred to pt preference of "word puzzles and games" (pt)     SLP SHORT TERM GOAL #2   Title  pt will functionally use speech and language compensations in 8 minutes simple conversation over 2 sessions    Status  Partially Met   3 minutes with min questioning cues     SLP SHORT TERM GOAL #3   Title  pt will be provided a quality of life questionnaire/assessment to measure pt's speech related quality of life    Status  Deferred      SLP SHORT TERM GOAL #4   Title  pt will tell SLP of 3 ways to compensate for decr'd spoken or written language skills in pt's functional situations over three sessions with modified  independence    Status  Partially Met   1/3 sessions      SLP Long Term Goals - 02/05/18 1715      SLP LONG TERM GOAL #1   Title  in 5 minutes simple conversation, pt will abandon no more than 3 utterances/ideas    Status  Achieved      SLP LONG TERM GOAL #2   Title  in 8 minutes simple conversation pt will use compensatory strategies successfully to produce speech/language in WFL/WNL manner over three sessions    Time  3    Period  Weeks    Status  Revised      SLP LONG TERM GOAL #3   Title  pt will perform speech precision/verbal apraxia HEP with modified independence over 3 sessions    Time  3    Period  Weeks    Status  On-going      SLP LONG TERM GOAL #4   Title  pt will name >12 items (average) in simple categories in one minute over three sessions    Time  3    Period  Weeks    Status  Revised       Plan - 02/05/18 1714    Clinical  Impression Statement  Pt cont to present today with symptoms not unlike those of either progressive nonfluent or logopenic subtypes of primary progressive aphasia. SLP cont to work with pt today forging/maintaining neural linguistic connections as well as use of compensations for her verbal expression. Today she entered ST room with questions about [employer] website to practice her speech and language on her iPhone. I suggested her iPad at home and then educated/guided pt through some practice tasks on iPad. She would cont to benefit from skilled ST primarily to learn and use compensatory techniques to assist with verbal communication, as well as attempt to improve pt's preserved language function. SLP will suggest augmentative/alternative device in the future, if clinically appropriate.     Speech Therapy Frequency  2x / week    Duration  --   8 weeks or 17 sessions   Treatment/Interventions  Compensatory strategies;Patient/family education;Multimodal communcation approach;SLP instruction and feedback;Internal/external aids;Compensatory techniques;Language facilitation    Potential to Achieve Goals  Fair    Potential Considerations  Medical prognosis;Ability to learn/carryover information       Patient will benefit from skilled therapeutic intervention in order to improve the following deficits and impairments:   Verbal apraxia  Aphasia    Problem List Patient Active Problem List   Diagnosis Date Noted  . DYSLIPIDEMIA 03/16/2009  . HYPERTENSION 03/16/2009    [facility] ,MS, CCC-SLP  02/05/2018, 5:15 PM  [facility] [address] [place], [place], [address] Phone: [phone]   Fax:  [phone]   Name: Melinda Kelly MRN: [id number] Date of Birth: October 18, 1945

## 2018-02-11 ENCOUNTER — Ambulatory Visit: Payer: Medicare HMO | Attending: Internal Medicine

## 2018-02-11 DIAGNOSIS — R4701 Aphasia: Secondary | ICD-10-CM | POA: Diagnosis not present

## 2018-02-11 DIAGNOSIS — R482 Apraxia: Secondary | ICD-10-CM | POA: Diagnosis not present

## 2018-02-11 NOTE — Patient Instructions (Signed)
Write down any questions you have for next session.  I will make you a packet of material for you to do.

## 2018-02-11 NOTE — Therapy (Signed)
[facility] [address] [place], [place], [address] Phone: [phone]   Fax:  [phone]  Speech Language Pathology Treatment  Patient Details  Name: Melinda Kelly MRN: [id number] Date of Birth: 02-07-1945 Referring Provider (SLP): Deanna Artis MD   Encounter Date: 02/11/2018  End of Session - 02/11/18 1159    Visit Number  13    Number of Visits  17    Date for SLP Re-Evaluation  03/14/18    Activity Tolerance  Patient tolerated treatment well       Past Medical History:  Diagnosis Date  . Dyslipidemia   . HTN (hypertension)     Past Surgical History:  Procedure Laterality Date  . BREAST LUMPECTOMY    . TANDEM AND OVOID INSERTION      There were no vitals filed for this visit.  Subjective Assessment - 02/11/18 1153    Subjective  "Glendell Docker I forgot my pad (IPad)."    Currently in Pain?  No/denies            ADULT SLP TREATMENT - 02/11/18 1153      General Information   Behavior/Cognition  Alert;Cooperative;Pleasant mood;Requires cueing      Treatment Provided   Treatment provided  Cognitive-Linquistic      Cognitive-Linquistic Treatment   Treatment focused on  Apraxia;Aphasia    Skilled Treatment  Pt agrees she will be ready for d/c next session. SLP assisted pt with logging into [place] and with comprehension of task (news article) and sentence completion. Pt was educated how to change levels if present level is too easy. Pt with 7/9 performance with news articles and 7/8 with spelling/sentence completion. Pt required min-mod cues for min-mod complex language in news article re: coronavirus. SLP offered to make a packet of language tasks for pt and she agreed this would be helpful for her.       Assessment / Recommendations / Plan   Plan  Continue with current plan of care      Progression Toward Goals   Progression toward goals  Progressing toward goals       SLP  Education - 02/11/18 2219    Education Details  TalkPath Therapy on iPad - login    Person(s) Educated  Patient    Methods  Explanation;Demonstration    Comprehension  Verbalized understanding;Returned demonstration       SLP Short Term Goals - 01/16/18 1554      SLP SHORT TERM GOAL #1   Title  pt to perform apraxia/speech precision HEP of salient words/short phrases with rare min A over 3 sessions    Status  Deferred   deferred to pt preference of "word puzzles and games" (pt)     SLP SHORT TERM GOAL #2   Title  pt will functionally use speech and language compensations in 8 minutes simple conversation over 2 sessions    Status  Partially Met   3 minutes with min questioning cues     SLP SHORT TERM GOAL #3   Title  pt will be provided a quality of life questionnaire/assessment to measure pt's speech related quality of life    Status  Deferred      SLP SHORT TERM GOAL #4   Title  pt will tell SLP of 3 ways to compensate for decr'd spoken or written language skills in pt's functional situations over three sessions with modified independence    Status  Partially Met   1/3 sessions  SLP Long Term Goals - 02/11/18 2220      SLP LONG TERM GOAL #1   Title  in 5 minutes simple conversation, pt will abandon no more than 3 utterances/ideas    Status  Achieved      SLP LONG TERM GOAL #2   Title  in 8 minutes simple conversation pt will use compensatory strategies successfully to produce speech/language in WFL/WNL manner over three sessions    Time  3    Period  Weeks    Status  Revised      SLP LONG TERM GOAL #3   Title  pt will perform speech precision/verbal apraxia HEP with modified independence over 3 sessions    Time  2    Period  Weeks    Status  On-going      SLP LONG TERM GOAL #4   Title  pt will name >12 items (average) in simple categories in one minute over three sessions    Time  2    Period  Weeks    Status  Revised       Plan - 02/11/18 2219     Clinical Impression Statement  Pt presents with symptoms not unlike those of either progressive nonfluent or logopenic subtypes of primary progressive aphasia. SLP cont to work with pt today forging/maintaining neural Patent examiner media (Talk Path Therapy) as well as use of compensations for her verbal expression. Today she entered ST room with questions about [employer] website to practice her speech and language on her iPhone. I suggested her iPad at home and then educated/guided pt through some practice tasks on iPad. She would cont to benefit from skilled ST primarily to learn and use compensatory techniques to assist with verbal communication, as well as attempt to improve pt's preserved language function. SLP will suggest augmentative/alternative device in the future, if clinically appropriate.     Speech Therapy Frequency  2x / week    Duration  --   8 weeks or 17 sessions   Treatment/Interventions  Compensatory strategies;Patient/family education;Multimodal communcation approach;SLP instruction and feedback;Internal/external aids;Compensatory techniques;Language facilitation    Potential to Achieve Goals  Fair    Potential Considerations  Medical prognosis;Ability to learn/carryover information       Patient will benefit from skilled therapeutic intervention in order to improve the following deficits and impairments:   Verbal apraxia  Aphasia    Problem List Patient Active Problem List   Diagnosis Date Noted  . DYSLIPIDEMIA 03/16/2009  . HYPERTENSION 03/16/2009    [facility] ,[place], [place]  02/11/2018, 10:37 PM  [facility] [address] [place], [place], [address] Phone: [phone]   Fax:  [phone]   Name: MAR ZETTLER MRN: [id number] Date of Birth: 07/26/1945

## 2018-02-13 ENCOUNTER — Ambulatory Visit: Payer: Medicare HMO

## 2018-02-13 DIAGNOSIS — R4701 Aphasia: Secondary | ICD-10-CM

## 2018-02-13 DIAGNOSIS — R482 Apraxia: Secondary | ICD-10-CM

## 2018-02-13 NOTE — Therapy (Signed)
[facility] [address] [place], [place], [address] Phone: [phone]   Fax:  [phone]  Speech Language Pathology Treatment  Patient Details  Name: Melinda Kelly MRN: [id number] Date of Birth: 13-Oct-1945 Referring Provider (SLP): Deanna Artis MD   Encounter Date: 02/13/2018  End of Session - 02/13/18 1634    Visit Number  14    Number of Visits  17    Date for SLP Re-Evaluation  03/14/18    SLP Start Time  1533    SLP Stop Time   1696    SLP Time Calculation (min)  42 min       Past Medical History:  Diagnosis Date  . Dyslipidemia   . HTN (hypertension)     Past Surgical History:  Procedure Laterality Date  . BREAST LUMPECTOMY    . TANDEM AND OVOID INSERTION      There were no vitals filed for this visit.  Subjective Assessment - 02/13/18 1545    Subjective  "I really think I'm talking - - better than before."    Currently in Pain?  Yes    Pain Score  8     Pain Location  Leg    Pain Orientation  Right    Pain Onset  More than a month ago    Pain Frequency  Constant    Effect of Pain on Daily Activities  woke pt up last night            ADULT SLP TREATMENT - 02/13/18 1546      General Information   Behavior/Cognition  Alert;Cooperative;Pleasant mood;Requires cueing      Treatment Provided   Treatment provided  Cognitive-Linquistic      Cognitive-Linquistic Treatment   Treatment focused on  Aphasia;Apraxia    Skilled Treatment  Pt brought a play along with her that she reads with a thespian gropu at [facility] and inquires of SLP what she could do to make the reading aloud easier. SLP encouraged pt to pre-read the play at least 3 times, or to pick a small part and pre-read that part at least three times.  Pt asked how to compensate for the phone as she has difficulty with phone conversations as many often involve details regarding some service (insurance, city services, etc). SLP then  highlighted and explained some tasks on Constant Therapy app that pt could begin that would be helpful. Provided pt with handout re: Constant Therapy.      Assessment / Recommendations / Plan   Plan  Discharge SLP treatment due to (comment)      Progression Toward Goals   Progression toward goals  --   d/c day - see goal summary      SLP Education - 02/13/18 1634    Education Details  Constant Therapy app, preplanning conversational language to assist with verbal output/compensations    Person(s) Educated  Patient    Methods  Explanation;Demonstration;Handout    Comprehension  Verbalized understanding;Returned demonstration       SLP Short Term Goals - 01/16/18 1554      SLP SHORT TERM GOAL #1   Title  pt to perform apraxia/speech precision HEP of salient words/short phrases with rare min A over 3 sessions    Status  Deferred   deferred to pt preference of "word puzzles and games" (pt)     SLP SHORT TERM GOAL #2   Title  pt will functionally use speech and language compensations in 8  minutes simple conversation over 2 sessions    Status  Partially Met   3 minutes with min questioning cues     SLP SHORT TERM GOAL #3   Title  pt will be provided a quality of life questionnaire/assessment to measure pt's speech related quality of life    Status  Deferred      SLP SHORT TERM GOAL #4   Title  pt will tell SLP of 3 ways to compensate for decr'd spoken or written language skills in pt's functional situations over three sessions with modified independence    Status  Partially Met   1/3 sessions      SLP Long Term Goals - 02/11/18 2220      SLP LONG TERM GOAL #1   Title  in 5 minutes simple conversation, pt will abandon no more than 3 utterances/ideas    Status  Achieved      SLP LONG TERM GOAL #2   Title  in 8 minutes simple conversation pt will use compensatory strategies successfully to produce speech/language in WFL/WNL manner over three sessions    Time  3    Period   Weeks    Status  Revised      SLP LONG TERM GOAL #3   Title  pt will perform speech precision/verbal apraxia HEP with modified independence over 3 sessions    Time  2    Period  Weeks    Status  On-going      SLP LONG TERM GOAL #4   Title  pt will name >12 items (average) in simple categories in one minute over three sessions    Time  2    Period  Weeks    Status  Revised       Plan - 02/13/18 1635    Clinical Impression Statement  Pt presents with symptoms not unlike those of either progressive nonfluent or logopenic subtypes of primary progressive aphasia. SLP cont to work with pt today re: therapy tasks using digital media (Constant Therapy) as well as explaining and educating re: use of compensations for her verbal expression. PT agreed about d/c today adn thanked SLP for the assistance.    Speech Therapy Frequency  --    Duration  --    Treatment/Interventions  Compensatory strategies;Patient/family education;Multimodal communcation approach;SLP instruction and feedback;Internal/external aids;Compensatory techniques;Language facilitation    Potential to Achieve Goals  Fair    Potential Considerations  Medical prognosis;Ability to learn/carryover information       Patient will benefit from skilled therapeutic intervention in order to improve the following deficits and impairments:   Verbal apraxia  Aphasia   SPEECH THERAPY DISCHARGE SUMMARY  Visits from Start of Care: 14   Current functional level related to goals / functional outcomes: Pt partially met goals (see above).  Simple conversation is functional with extra time, mod complex conversation requires assistance from listener.  This course of therapy was successful in that pt was able to learn compensations for assistance in social and home situations. If the patient wishes to pursue a speech generating device (SGD) such as the Lingraphica, she should receive further therapy in the future.   Remaining  deficits: Mod-severe apraxia/aphasia.   Education / Equipment: Compensations for verbal output, digital means of practicing speech/language.   Plan: Patient agrees to discharge.  Patient goals were partially met. Patient is being discharged due to not returning since the last visit.  ?????reaching her current rehab potential.       Problem  List Patient Active Problem List   Diagnosis Date Noted  . DYSLIPIDEMIA 03/16/2009  . HYPERTENSION 03/16/2009    [facility] ,[place], [place]  02/13/2018, 4:38 PM  [facility] [address] [place], [place], [address] Phone: [phone]   Fax:  [phone]   Name: Melinda Kelly MRN: [id number] Date of Birth: 12-04-1945

## 2018-03-05 DIAGNOSIS — I10 Essential (primary) hypertension: Secondary | ICD-10-CM | POA: Diagnosis not present

## 2018-03-05 DIAGNOSIS — R809 Proteinuria, unspecified: Secondary | ICD-10-CM | POA: Diagnosis not present

## 2018-03-05 DIAGNOSIS — Z0000 Encounter for general adult medical examination without abnormal findings: Secondary | ICD-10-CM | POA: Diagnosis not present

## 2018-03-05 DIAGNOSIS — L02214 Cutaneous abscess of groin: Secondary | ICD-10-CM | POA: Diagnosis not present

## 2018-03-05 DIAGNOSIS — R7303 Prediabetes: Secondary | ICD-10-CM | POA: Diagnosis not present

## 2018-03-05 DIAGNOSIS — M79604 Pain in right leg: Secondary | ICD-10-CM | POA: Diagnosis not present

## 2018-03-05 DIAGNOSIS — E785 Hyperlipidemia, unspecified: Secondary | ICD-10-CM | POA: Diagnosis not present

## 2018-03-05 DIAGNOSIS — M8000XS Age-related osteoporosis with current pathological fracture, unspecified site, sequela: Secondary | ICD-10-CM | POA: Diagnosis not present

## 2018-03-05 LAB — HEMOGLOBIN A1C: Hemoglobin A1C: 5.6

## 2018-03-05 LAB — BASIC METABOLIC PANEL: Glucose: 99

## 2018-03-06 DIAGNOSIS — E785 Hyperlipidemia, unspecified: Secondary | ICD-10-CM | POA: Diagnosis not present

## 2018-03-06 DIAGNOSIS — R0683 Snoring: Secondary | ICD-10-CM | POA: Diagnosis not present

## 2018-03-06 DIAGNOSIS — G3184 Mild cognitive impairment, so stated: Secondary | ICD-10-CM | POA: Diagnosis not present

## 2018-03-06 DIAGNOSIS — M79604 Pain in right leg: Secondary | ICD-10-CM | POA: Diagnosis not present

## 2018-03-06 DIAGNOSIS — R634 Abnormal weight loss: Secondary | ICD-10-CM | POA: Diagnosis not present

## 2018-03-06 DIAGNOSIS — I10 Essential (primary) hypertension: Secondary | ICD-10-CM | POA: Diagnosis not present

## 2018-03-06 DIAGNOSIS — M810 Age-related osteoporosis without current pathological fracture: Secondary | ICD-10-CM | POA: Diagnosis not present

## 2018-03-20 DIAGNOSIS — M79604 Pain in right leg: Secondary | ICD-10-CM | POA: Diagnosis not present

## 2018-03-31 ENCOUNTER — Other Ambulatory Visit: Payer: Self-pay | Admitting: Orthopedic Surgery

## 2018-03-31 DIAGNOSIS — R5383 Other fatigue: Secondary | ICD-10-CM | POA: Diagnosis not present

## 2018-03-31 DIAGNOSIS — M545 Low back pain, unspecified: Secondary | ICD-10-CM

## 2018-03-31 DIAGNOSIS — E559 Vitamin D deficiency, unspecified: Secondary | ICD-10-CM | POA: Diagnosis not present

## 2018-03-31 DIAGNOSIS — M898X5 Other specified disorders of bone, thigh: Secondary | ICD-10-CM | POA: Diagnosis not present

## 2018-03-31 DIAGNOSIS — M810 Age-related osteoporosis without current pathological fracture: Secondary | ICD-10-CM | POA: Diagnosis not present

## 2018-04-04 ENCOUNTER — Other Ambulatory Visit: Payer: Self-pay

## 2018-04-04 ENCOUNTER — Ambulatory Visit
Admission: RE | Admit: 2018-04-04 | Discharge: 2018-04-04 | Disposition: A | Discharge status: Home / Self Care | Payer: Medicare HMO | Source: Ambulatory Visit | Attending: Orthopedic Surgery | Admitting: Orthopedic Surgery

## 2018-04-04 DIAGNOSIS — M545 Low back pain, unspecified: Secondary | ICD-10-CM

## 2018-04-04 DIAGNOSIS — M48061 Spinal stenosis, lumbar region without neurogenic claudication: Secondary | ICD-10-CM | POA: Diagnosis not present

## 2018-04-08 ENCOUNTER — Other Ambulatory Visit: Payer: Medicare HMO

## 2018-04-11 DIAGNOSIS — M5416 Radiculopathy, lumbar region: Secondary | ICD-10-CM | POA: Diagnosis not present

## 2018-04-11 DIAGNOSIS — M810 Age-related osteoporosis without current pathological fracture: Secondary | ICD-10-CM | POA: Diagnosis not present

## 2018-04-11 DIAGNOSIS — M5116 Intervertebral disc disorders with radiculopathy, lumbar region: Secondary | ICD-10-CM | POA: Diagnosis not present

## 2018-04-11 DIAGNOSIS — M79604 Pain in right leg: Secondary | ICD-10-CM | POA: Diagnosis not present

## 2018-04-16 DIAGNOSIS — M79604 Pain in right leg: Secondary | ICD-10-CM | POA: Diagnosis not present

## 2018-04-16 DIAGNOSIS — M5116 Intervertebral disc disorders with radiculopathy, lumbar region: Secondary | ICD-10-CM | POA: Diagnosis not present

## 2018-04-16 DIAGNOSIS — M810 Age-related osteoporosis without current pathological fracture: Secondary | ICD-10-CM | POA: Diagnosis not present

## 2018-04-16 DIAGNOSIS — M5416 Radiculopathy, lumbar region: Secondary | ICD-10-CM | POA: Diagnosis not present

## 2018-04-17 DIAGNOSIS — M810 Age-related osteoporosis without current pathological fracture: Secondary | ICD-10-CM | POA: Diagnosis not present

## 2018-04-17 DIAGNOSIS — M79604 Pain in right leg: Secondary | ICD-10-CM | POA: Diagnosis not present

## 2018-04-17 DIAGNOSIS — M5116 Intervertebral disc disorders with radiculopathy, lumbar region: Secondary | ICD-10-CM | POA: Diagnosis not present

## 2018-04-17 DIAGNOSIS — M5416 Radiculopathy, lumbar region: Secondary | ICD-10-CM | POA: Diagnosis not present

## 2018-04-23 DIAGNOSIS — M5416 Radiculopathy, lumbar region: Secondary | ICD-10-CM | POA: Diagnosis not present

## 2018-04-23 DIAGNOSIS — M5116 Intervertebral disc disorders with radiculopathy, lumbar region: Secondary | ICD-10-CM | POA: Diagnosis not present

## 2018-04-23 DIAGNOSIS — M79604 Pain in right leg: Secondary | ICD-10-CM | POA: Diagnosis not present

## 2018-04-23 DIAGNOSIS — M810 Age-related osteoporosis without current pathological fracture: Secondary | ICD-10-CM | POA: Diagnosis not present

## 2018-04-24 DIAGNOSIS — M5416 Radiculopathy, lumbar region: Secondary | ICD-10-CM | POA: Diagnosis not present

## 2018-04-24 DIAGNOSIS — M810 Age-related osteoporosis without current pathological fracture: Secondary | ICD-10-CM | POA: Diagnosis not present

## 2018-04-24 DIAGNOSIS — M79604 Pain in right leg: Secondary | ICD-10-CM | POA: Diagnosis not present

## 2018-04-24 DIAGNOSIS — M5116 Intervertebral disc disorders with radiculopathy, lumbar region: Secondary | ICD-10-CM | POA: Diagnosis not present

## 2018-04-28 DIAGNOSIS — M5416 Radiculopathy, lumbar region: Secondary | ICD-10-CM | POA: Diagnosis not present

## 2018-04-28 DIAGNOSIS — M79604 Pain in right leg: Secondary | ICD-10-CM | POA: Diagnosis not present

## 2018-04-28 DIAGNOSIS — M5116 Intervertebral disc disorders with radiculopathy, lumbar region: Secondary | ICD-10-CM | POA: Diagnosis not present

## 2018-04-28 DIAGNOSIS — M810 Age-related osteoporosis without current pathological fracture: Secondary | ICD-10-CM | POA: Diagnosis not present

## 2018-05-01 DIAGNOSIS — M5416 Radiculopathy, lumbar region: Secondary | ICD-10-CM | POA: Diagnosis not present

## 2018-05-01 DIAGNOSIS — M79604 Pain in right leg: Secondary | ICD-10-CM | POA: Diagnosis not present

## 2018-05-01 DIAGNOSIS — M810 Age-related osteoporosis without current pathological fracture: Secondary | ICD-10-CM | POA: Diagnosis not present

## 2018-05-01 DIAGNOSIS — M5116 Intervertebral disc disorders with radiculopathy, lumbar region: Secondary | ICD-10-CM | POA: Diagnosis not present

## 2018-05-05 DIAGNOSIS — M810 Age-related osteoporosis without current pathological fracture: Secondary | ICD-10-CM | POA: Diagnosis not present

## 2018-05-05 DIAGNOSIS — M5416 Radiculopathy, lumbar region: Secondary | ICD-10-CM | POA: Diagnosis not present

## 2018-05-05 DIAGNOSIS — M5116 Intervertebral disc disorders with radiculopathy, lumbar region: Secondary | ICD-10-CM | POA: Diagnosis not present

## 2018-05-05 DIAGNOSIS — M79604 Pain in right leg: Secondary | ICD-10-CM | POA: Diagnosis not present

## 2018-05-08 DIAGNOSIS — M79604 Pain in right leg: Secondary | ICD-10-CM | POA: Diagnosis not present

## 2018-05-08 DIAGNOSIS — M5416 Radiculopathy, lumbar region: Secondary | ICD-10-CM | POA: Diagnosis not present

## 2018-05-08 DIAGNOSIS — M810 Age-related osteoporosis without current pathological fracture: Secondary | ICD-10-CM | POA: Diagnosis not present

## 2018-05-08 DIAGNOSIS — M5116 Intervertebral disc disorders with radiculopathy, lumbar region: Secondary | ICD-10-CM | POA: Diagnosis not present

## 2018-05-28 ENCOUNTER — Other Ambulatory Visit: Payer: Medicare HMO

## 2018-06-19 DIAGNOSIS — R69 Illness, unspecified: Secondary | ICD-10-CM | POA: Diagnosis not present

## 2018-07-01 ENCOUNTER — Ambulatory Visit (INDEPENDENT_AMBULATORY_CARE_PROVIDER_SITE_OTHER): Payer: Medicare HMO | Admitting: Family Medicine

## 2018-07-01 ENCOUNTER — Encounter: Payer: Self-pay | Admitting: Family Medicine

## 2018-07-01 ENCOUNTER — Other Ambulatory Visit: Payer: Self-pay

## 2018-07-01 VITALS — BP 128/74 | HR 90 | Temp 99.0°F | Resp 14 | Ht 63.0 in | Wt 112.2 lb

## 2018-07-01 DIAGNOSIS — M79604 Pain in right leg: Secondary | ICD-10-CM | POA: Diagnosis not present

## 2018-07-01 DIAGNOSIS — E782 Mixed hyperlipidemia: Secondary | ICD-10-CM | POA: Diagnosis not present

## 2018-07-01 DIAGNOSIS — M810 Age-related osteoporosis without current pathological fracture: Secondary | ICD-10-CM | POA: Insufficient documentation

## 2018-07-01 DIAGNOSIS — I10 Essential (primary) hypertension: Secondary | ICD-10-CM

## 2018-07-01 DIAGNOSIS — R413 Other amnesia: Secondary | ICD-10-CM | POA: Insufficient documentation

## 2018-07-01 HISTORY — DX: Other amnesia: R41.3

## 2018-07-01 NOTE — Patient Instructions (Signed)
Please return in 6-12 weeks for recheck.   It was a pleasure meeting you today! Thank you for choosing [facility] to meet your healthcare needs! I truly look forward to working with you. If you have any questions or concerns, please send me a message via Mychart or call the office at [phone].  Please get me the names of your Sports Medicine Doctor and last GI doctor who did you colonoscopy.  We will request records from Dr. Addison Lank and SM.

## 2018-07-01 NOTE — Progress Notes (Signed)
Subjective  CC:  Chief Complaint  Patient presents with  . Establish Care    Previous PCP Dr. Theadore Nan at [facility], last CPE unknown  . Leg Pain    Right leg, 1 year. Has had PT but pain is still present. Pain has improved with PT but still getting woke up occassionally with the pain  . Hypertension    Taking Amlodipine 5mg  daily    HPI: Melinda Kelly is a 73 y.o. female who presents to [facility] today to establish care with me as a new patient.   She has the following concerns or needs:  73 yo presents independently to establish care. She is quite hard to follow. She prefers that her husband not accompany her to visit.   I reviewed neuro records from [facility] and speech therapy records. She is currently being assessed for a neurocognitive disorder: ? Dementia vs FTD vs other. She seems not completely aware of this possibility. She is aware of her speech problems and feels they are improving with therapy. However, she is difficult to understand due to her speech problems.   PMH: HTN and dyslipidemia. She may also have osteoporosis but refuses tx and osteoarthritis in the knees. I need to get old records. C/o 1 year of right thigh muscle pain treated by SM and PT.   She reports she had cpe and labs in January.   Assessment  1. Mixed hyperlipidemia   2. Essential hypertension   3. Age-related osteoporosis without current pathological fracture   4. Memory disorder   5. Right leg pain      Plan   HLD and HTN: reportedly well controlled. bp is good today. Will get old records and continue current meds. I recommend close f/u  Memory and speech disturbance- will follow with neuro and try to help her navigate. She is not on meds at this time for this.   Leg pain: get old records.   Follow up:  Return in about 8 weeks (around 08/26/2018) for recheck. No orders of the defined types were placed in this encounter.  No orders of the defined types were  placed in this encounter.    Depression screen [facility] 2/9 07/01/2018  Decreased Interest 0  Down, Depressed, Hopeless 0  PHQ - 2 Score 0    We updated and reviewed the patient's past history in detail and it is documented below.  Patient Active Problem List   Diagnosis Date Noted  . Memory disorder 07/01/2018    Seeing [facility].  [facility] 18/30 12/2017 Brain MRI: sm vessel disease. Apraxia and aphasia   . Right leg pain 07/01/2018  . Age-related osteoporosis without current pathological fracture 07/01/2018  . Mixed hyperlipidemia 03/16/2009  . Essential hypertension 03/16/2009   Health Maintenance  Topic Date Due  . Hepatitis C Screening  [birth year]  . TETANUS/TDAP  01/10/1965  . MAMMOGRAM  01/11/1996  . COLONOSCOPY  01/11/1996  . DEXA SCAN  01/11/2011  . PNA vac Low Risk Adult (1 of 2 - PCV13) 01/11/2011  . INFLUENZA VACCINE  08/09/2018   Immunization History  Administered Date(s) Administered  . Influenza, High Dose Seasonal PF 09/18/2017  . Zoster Recombinat (Shingrix) 11/05/2017, 01/11/2018   Current Meds  Medication Sig  . amLODipine (NORVASC) 5 MG tablet Take by mouth.  . Boron 3 MG CAPS Take 3 mg by mouth daily.  . Cholecalciferol (VITAMIN D) 2000 UNITS tablet Take 2,000 Units by mouth daily.    Marland Kitchen  Magnesium 250 MG TABS Take 1 tablet by mouth 2 (two) times a day.  . Vitamin K, Phytonadione, 100 MCG TABS Take 1 tablet by mouth daily.    Allergies: Patient has No Known Allergies. Past Medical History Patient  has a past medical history of Dyslipidemia, HTN (hypertension), and Memory disorder (07/01/2018). Past Surgical History Patient  has a past surgical history that includes Breast lumpectomy and Tandem and ovoid insertion. Family History: Patient family history is not on file. Social History:  Patient  reports that she has quit smoking. She has never used smokeless tobacco. She reports current alcohol use. She reports that she does not use drugs.   Review of Systems: Constitutional: negative for fever or malaise Ophthalmic: negative for photophobia, double vision or loss of vision Cardiovascular: negative for chest pain, dyspnea on exertion, or new LE swelling Respiratory: negative for SOB or persistent cough Gastrointestinal: negative for abdominal pain, change in bowel habits or melena Genitourinary: negative for dysuria or gross hematuria Musculoskeletal: negative for new gait disturbance or muscular weakness Integumentary: negative for new or persistent rashes Neurological: negative for TIA or stroke symptoms Psychiatric: negative for SI or delusions Allergic/Immunologic: negative for hives  Patient Care Team    Relationship Specialty Notifications Start End  Leamon Arnt, MD PCP - General Family Medicine  07/01/18     Objective  Vitals: BP 128/74   Pulse 90   Temp 99 F (37.2 C) (Oral)   Resp 14   Ht 5\' 3"  (1.6 m)   Wt 112 lb 3.2 oz (50.9 kg)   SpO2 99%   BMI 19.88 kg/m  General:  Well developed, well nourished, no acute distress  Psych:  Alert and oriented, anxious appearing. Poor word finding HEENT:  Normocephalic, atraumatic, non-icteric sclera, PERRL, oropharynx is without mass or exudate, supple neck without adenopathy, mass or thyromegaly Cardiovascular:  RRR without gallop, rub or murmur, nondisplaced PMI Respiratory:  Good breath sounds bilaterally, CTAB with normal respiratory effort Gastrointestinal: normal bowel sounds, soft, non-tender, no noted masses. No HSM MSK: no deformities, contusions. Joints are without erythema or swelling Skin:  Warm, no rashes or suspicious lesions noted Neurologic:   Gross motor and sensory exams are normal. Normal gait   Commons side effects, risks, benefits, and alternatives for medications and treatment plan prescribed today were discussed, and the patient expressed understanding of the given instructions. Patient is instructed to call or message via MyChart if he/she  has any questions or concerns regarding our treatment plan. No barriers to understanding were identified. We discussed Red Flag symptoms and signs in detail. Patient expressed understanding regarding what to do in case of urgent or emergency type symptoms.   Medication list was reconciled, printed and provided to the patient in AVS. Patient instructions and summary information was reviewed with the patient as documented in the AVS. This note was prepared with assistance of Dragon voice recognition software. Occasional wrong-word or sound-a-like substitutions may have occurred due to the inherent limitations of voice recognition software

## 2018-07-03 DIAGNOSIS — R69 Illness, unspecified: Secondary | ICD-10-CM | POA: Diagnosis not present

## 2018-07-04 ENCOUNTER — Encounter: Payer: Self-pay | Admitting: Family Medicine

## 2018-08-15 DIAGNOSIS — H524 Presbyopia: Secondary | ICD-10-CM | POA: Diagnosis not present

## 2018-08-19 DIAGNOSIS — Z1231 Encounter for screening mammogram for malignant neoplasm of breast: Secondary | ICD-10-CM | POA: Diagnosis not present

## 2018-08-27 DIAGNOSIS — G4719 Other hypersomnia: Secondary | ICD-10-CM | POA: Diagnosis not present

## 2018-09-17 ENCOUNTER — Ambulatory Visit: Payer: Medicare HMO | Admitting: Family Medicine

## 2018-09-17 DIAGNOSIS — G4719 Other hypersomnia: Secondary | ICD-10-CM | POA: Diagnosis not present

## 2018-09-17 DIAGNOSIS — R0681 Apnea, not elsewhere classified: Secondary | ICD-10-CM | POA: Diagnosis not present

## 2018-09-19 DIAGNOSIS — R69 Illness, unspecified: Secondary | ICD-10-CM | POA: Diagnosis not present

## 2018-10-23 DIAGNOSIS — Z0000 Encounter for general adult medical examination without abnormal findings: Secondary | ICD-10-CM | POA: Diagnosis not present

## 2018-10-23 DIAGNOSIS — I10 Essential (primary) hypertension: Secondary | ICD-10-CM | POA: Diagnosis not present

## 2018-10-30 DIAGNOSIS — R69 Illness, unspecified: Secondary | ICD-10-CM | POA: Diagnosis not present

## 2018-11-20 DIAGNOSIS — Z87891 Personal history of nicotine dependence: Secondary | ICD-10-CM | POA: Diagnosis not present

## 2018-11-20 DIAGNOSIS — Z833 Family history of diabetes mellitus: Secondary | ICD-10-CM | POA: Diagnosis not present

## 2018-11-20 DIAGNOSIS — I10 Essential (primary) hypertension: Secondary | ICD-10-CM | POA: Diagnosis not present

## 2019-01-14 DIAGNOSIS — D225 Melanocytic nevi of trunk: Secondary | ICD-10-CM | POA: Diagnosis not present

## 2019-01-14 DIAGNOSIS — L718 Other rosacea: Secondary | ICD-10-CM | POA: Diagnosis not present

## 2019-01-14 DIAGNOSIS — L720 Epidermal cyst: Secondary | ICD-10-CM | POA: Diagnosis not present

## 2019-01-14 DIAGNOSIS — L918 Other hypertrophic disorders of the skin: Secondary | ICD-10-CM | POA: Diagnosis not present

## 2019-01-14 DIAGNOSIS — L814 Other melanin hyperpigmentation: Secondary | ICD-10-CM | POA: Diagnosis not present

## 2019-01-14 DIAGNOSIS — D1801 Hemangioma of skin and subcutaneous tissue: Secondary | ICD-10-CM | POA: Diagnosis not present

## 2019-01-14 DIAGNOSIS — D2272 Melanocytic nevi of left lower limb, including hip: Secondary | ICD-10-CM | POA: Diagnosis not present

## 2019-01-14 DIAGNOSIS — L821 Other seborrheic keratosis: Secondary | ICD-10-CM | POA: Diagnosis not present

## 2019-01-14 DIAGNOSIS — D2261 Melanocytic nevi of right upper limb, including shoulder: Secondary | ICD-10-CM | POA: Diagnosis not present

## 2019-01-27 DIAGNOSIS — R69 Illness, unspecified: Secondary | ICD-10-CM | POA: Diagnosis not present

## 2019-01-28 ENCOUNTER — Ambulatory Visit: Payer: Medicare Other | Attending: Internal Medicine

## 2019-01-28 DIAGNOSIS — Z23 Encounter for immunization: Secondary | ICD-10-CM

## 2019-01-28 NOTE — Progress Notes (Signed)
   Covid-19 Vaccination Clinic  Name:  IVERSON BEILFUSS    MRN: [id number] DOB: Feb 17, 1945  01/28/2019  Ms. Sperling was observed post Covid-19 immunization for 15 minutes without incidence. She was provided with Vaccine Information Sheet and instruction to access the V-Safe system.   Ms. Gillen was instructed to call 911 with any severe reactions post vaccine: Marland Kitchen Difficulty breathing  . Swelling of your face and throat  . A fast heartbeat  . A bad rash all over your body  . Dizziness and weakness    Immunizations Administered    Name Date Dose VIS Date Route   Pfizer COVID-19 Vaccine 01/28/2019  4:33 PM 0.3 mL 12/19/2018 Intramuscular   Manufacturer: [facility]   Lot: [id number]   [place]: [id number]    \

## 2019-02-18 ENCOUNTER — Ambulatory Visit: Payer: Medicare HMO | Attending: Internal Medicine

## 2019-02-18 DIAGNOSIS — Z23 Encounter for immunization: Secondary | ICD-10-CM | POA: Insufficient documentation

## 2019-02-18 NOTE — Progress Notes (Signed)
   Covid-19 Vaccination Clinic  Name:  Melinda Kelly    MRN: [id number] DOB: 1945/05/09  02/18/2019  [name] was observed post Covid-19 immunization for 15 minutes without incidence. She was provided with Vaccine Information Sheet and instruction to access the V-Safe system.   [name] was instructed to call 911 with any severe reactions post vaccine: Marland Kitchen Difficulty breathing  . Swelling of your face and throat  . A fast heartbeat  . A bad rash all over your body  . Dizziness and weakness    Immunizations Administered    Name Date Dose VIS Date Route   Pfizer COVID-19 Vaccine 02/18/2019  9:00 AM 0.3 mL 12/19/2018 Intramuscular   Manufacturer: [place]   Lot: [id number]   [place]: [id number]

## 2019-02-25 ENCOUNTER — Ambulatory Visit: Payer: Medicare HMO

## 2019-03-02 DIAGNOSIS — G3184 Mild cognitive impairment, so stated: Secondary | ICD-10-CM | POA: Diagnosis not present

## 2019-03-25 DIAGNOSIS — I10 Essential (primary) hypertension: Secondary | ICD-10-CM | POA: Diagnosis not present

## 2019-06-02 DIAGNOSIS — Z9981 Dependence on supplemental oxygen: Secondary | ICD-10-CM | POA: Diagnosis not present

## 2019-06-02 DIAGNOSIS — Z87891 Personal history of nicotine dependence: Secondary | ICD-10-CM | POA: Diagnosis not present

## 2019-06-02 DIAGNOSIS — I10 Essential (primary) hypertension: Secondary | ICD-10-CM | POA: Diagnosis not present

## 2019-06-02 DIAGNOSIS — Z008 Encounter for other general examination: Secondary | ICD-10-CM | POA: Diagnosis not present

## 2019-07-07 DIAGNOSIS — R69 Illness, unspecified: Secondary | ICD-10-CM | POA: Diagnosis not present

## 2019-08-14 DIAGNOSIS — H52223 Regular astigmatism, bilateral: Secondary | ICD-10-CM | POA: Diagnosis not present

## 2019-10-01 DIAGNOSIS — R69 Illness, unspecified: Secondary | ICD-10-CM | POA: Diagnosis not present

## 2019-10-10 DIAGNOSIS — Z20828 Contact with and (suspected) exposure to other viral communicable diseases: Secondary | ICD-10-CM | POA: Diagnosis not present

## 2019-10-22 DIAGNOSIS — R482 Apraxia: Secondary | ICD-10-CM | POA: Diagnosis not present

## 2019-10-22 DIAGNOSIS — Z0000 Encounter for general adult medical examination without abnormal findings: Secondary | ICD-10-CM | POA: Diagnosis not present

## 2019-10-22 DIAGNOSIS — Z23 Encounter for immunization: Secondary | ICD-10-CM | POA: Diagnosis not present

## 2019-10-22 DIAGNOSIS — I10 Essential (primary) hypertension: Secondary | ICD-10-CM | POA: Diagnosis not present

## 2019-10-22 DIAGNOSIS — M810 Age-related osteoporosis without current pathological fracture: Secondary | ICD-10-CM | POA: Diagnosis not present

## 2019-10-22 DIAGNOSIS — Z136 Encounter for screening for cardiovascular disorders: Secondary | ICD-10-CM | POA: Diagnosis not present

## 2019-10-22 DIAGNOSIS — G3184 Mild cognitive impairment, so stated: Secondary | ICD-10-CM | POA: Diagnosis not present

## 2019-10-22 DIAGNOSIS — Z1159 Encounter for screening for other viral diseases: Secondary | ICD-10-CM | POA: Diagnosis not present

## 2019-10-28 IMAGING — MR MR HEAD WO/W CM
10 of 13 series · 36 of 48 positions shown · IV contrast (Yes)
Comparison: None.

CLINICAL DATA: Dementia without behavioral disturbance

EXAM:
MRI HEAD WITHOUT AND WITH CONTRAST
TECHNIQUE: Multiplanar, multiecho pulse sequences of the brain and surrounding
structures were obtained without and with intravenous contrast.
CONTRAST:  5 mL Gadovist IV

[Series 3: DWI · axial · 3.0mm · 1.09mm/px · z∈[-44,+104]mm · 8 of 102 slices shown (1 of 4)]
[im 1/102]
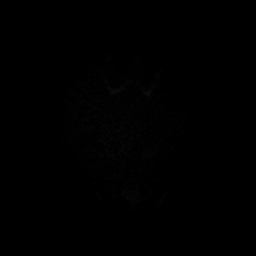
[im 15/102]
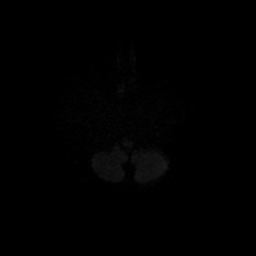
[im 29/102]
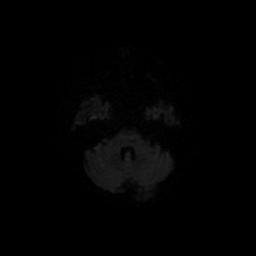
[im 44/102]
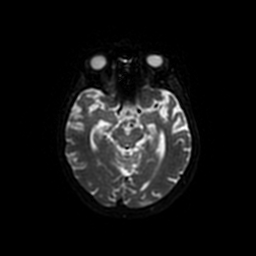
[im 58/102]
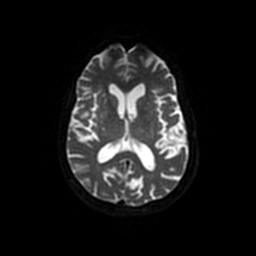
[im 73/102]
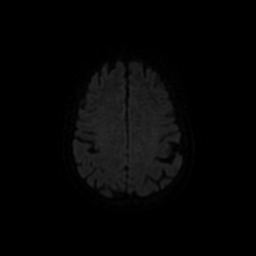
[im 87/102]
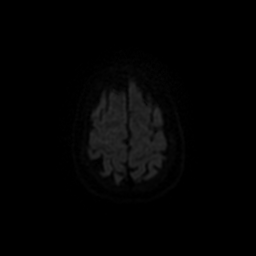
[im 102/102]
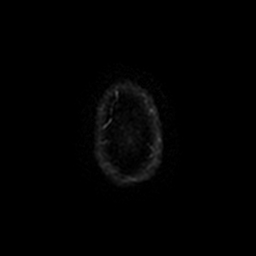

[Series 4: T1 · sagittal · 5.0mm · 0.47mm/px · 2 of 24 slices shown]
[im 1/24]
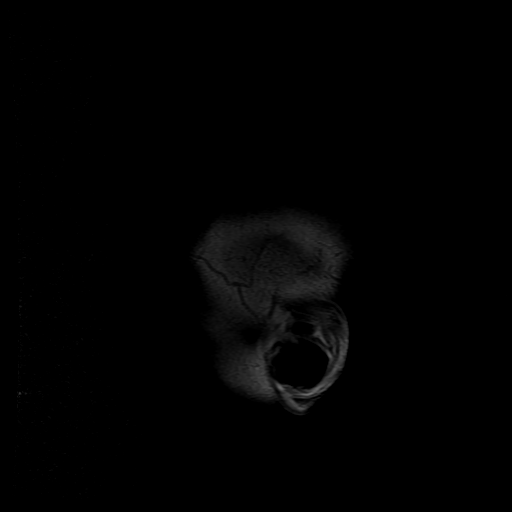
[im 24/24]
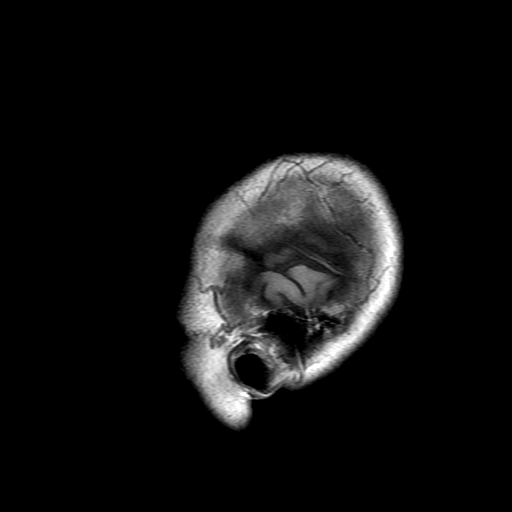

[Series 5: DWI · coronal · 3.0mm · 1.09mm/px · 8 of 102 slices shown (2 of 4)]
[im 1/102]
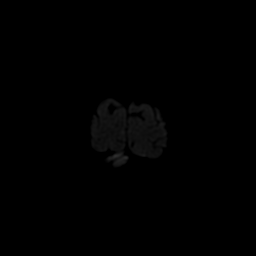
[im 15/102]
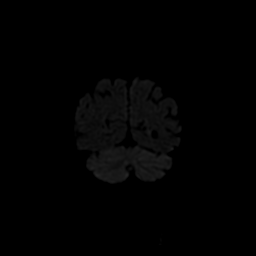
[im 29/102]
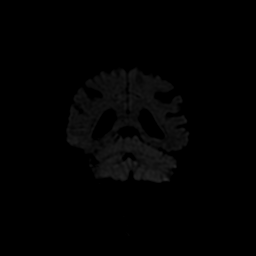
[im 44/102]
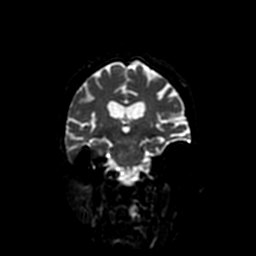
[im 58/102]
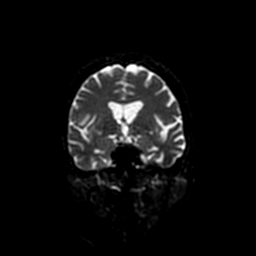
[im 73/102]
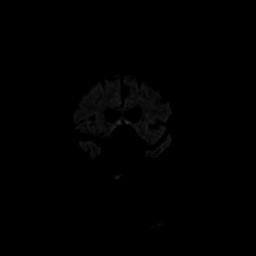
[im 87/102]
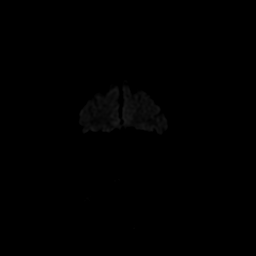
[im 102/102]
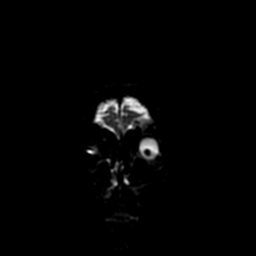

[Series 6: T2 · axial · 5.0mm · 0.43mm/px · z∈[-52,+108]mm · 2 of 28 slices shown]
[im 1/28]
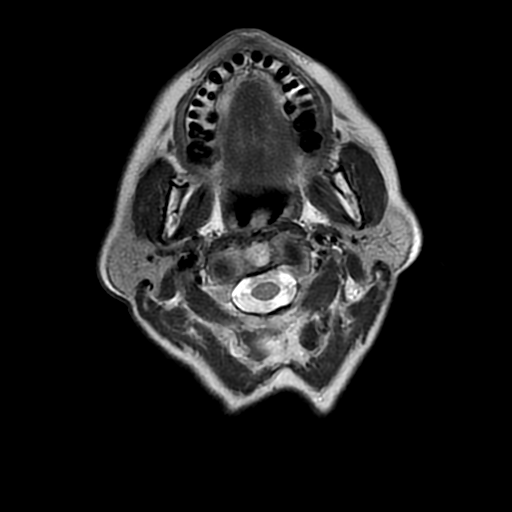
[im 28/28]
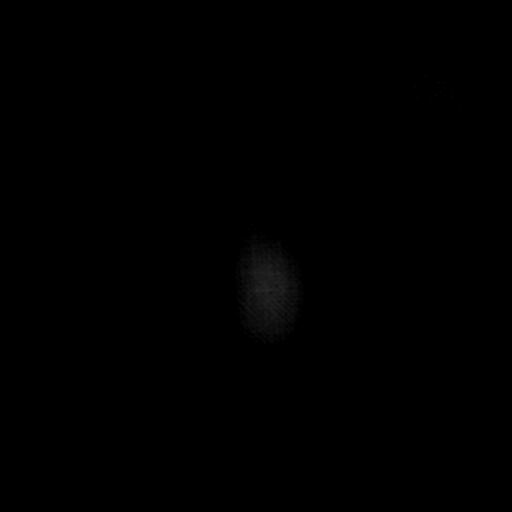

[Series 8: FLAIR · axial · 5.0mm · 0.43mm/px · z∈[-52,+108]mm · 2 of 28 slices shown]
[im 1/28]
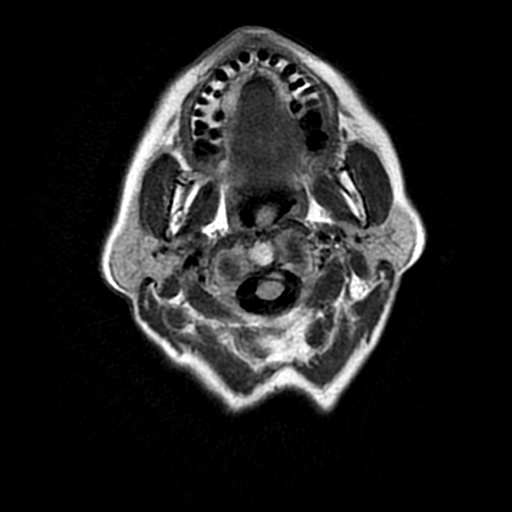
[im 28/28]
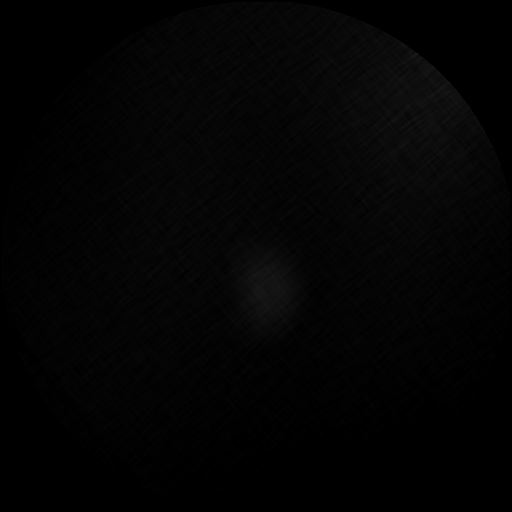

[Series 10: T2 post-contrast · coronal · 5.0mm · 0.47mm/px · 2 of 24 slices shown]
[im 1/24]
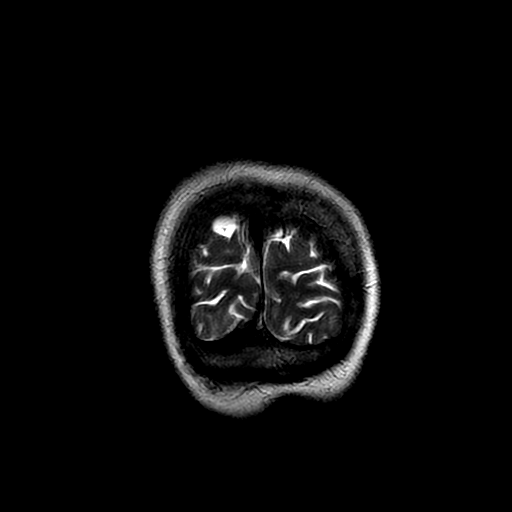
[im 24/24]
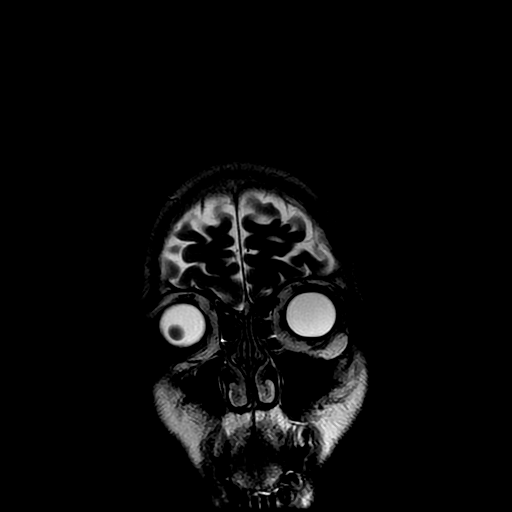

[Series 12: T1 post-contrast · coronal · 5.0mm · 0.47mm/px · 2 of 24 slices shown (1 of 2)]
[im 1/24]
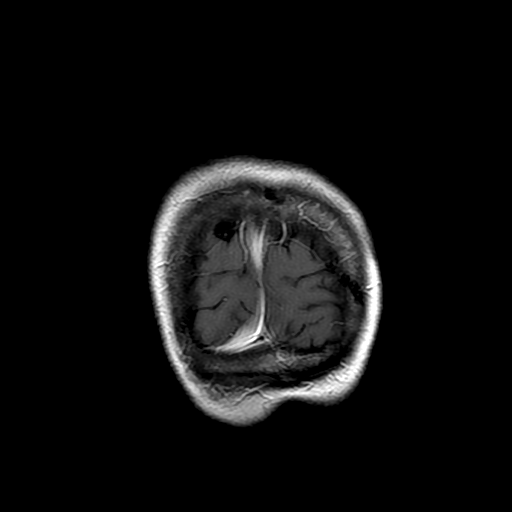
[im 24/24]
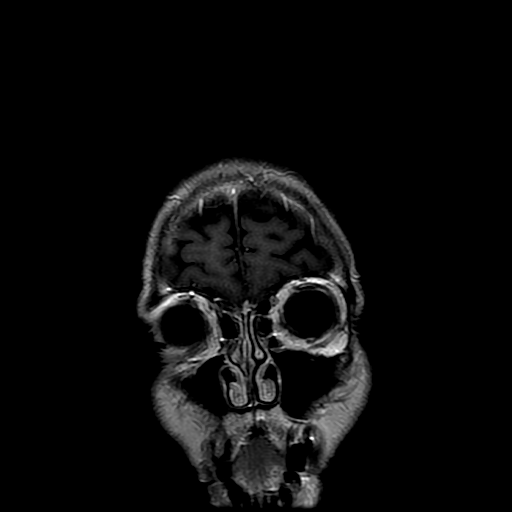

[Series 13: T1 post-contrast · sagittal · 5.0mm · 0.47mm/px · 2 of 24 slices shown (2 of 2)]
[im 1/24]
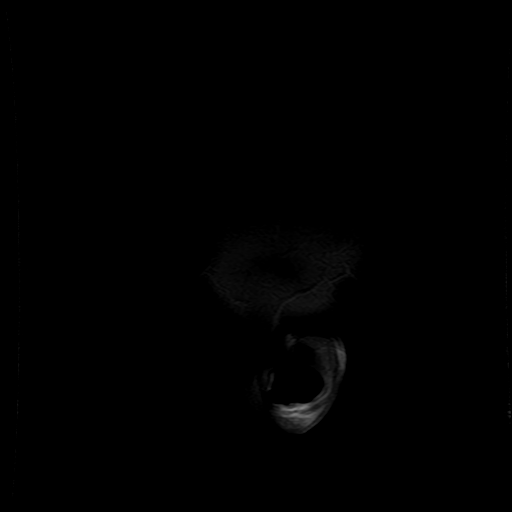
[im 24/24]
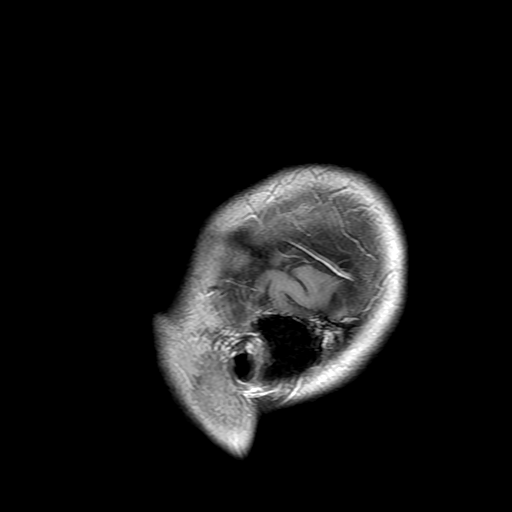

[Series 300: DWI · axial · 3.0mm · 1.09mm/px · z∈[-44,+104]mm · 4 of 51 slices shown (3 of 4)]
[im 1/51]
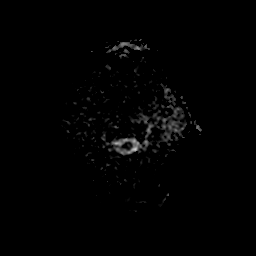
[im 17/51]
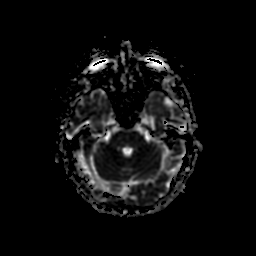
[im 34/51]
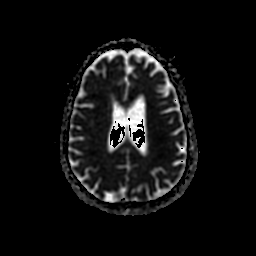
[im 51/51]
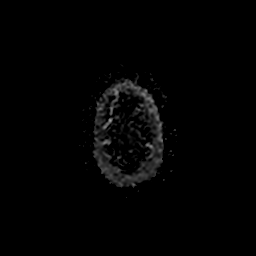

[Series 500: DWI · coronal · 3.0mm · 1.09mm/px · 4 of 51 slices shown (4 of 4)]
[im 1/51]
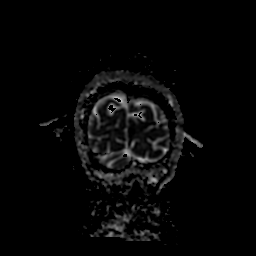
[im 17/51]
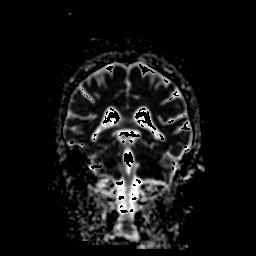
[im 34/51]
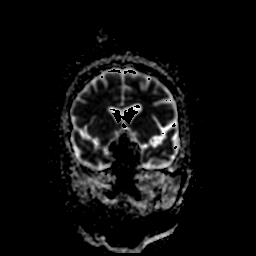
[im 51/51]
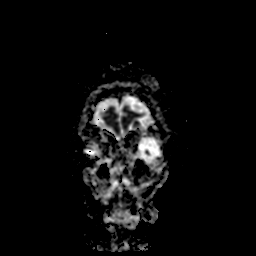

[36 of 48 positions shown; findings below may reference images not displayed]

FINDINGS: Brain: Mild cerebral atrophy. Scattered small white matter
hyperintensities, mild in degree. Mild patchy hyperintensity in the
pons.

Negative for acute infarct. Negative for hemorrhage mass or fluid
collection.

Normal enhancement postcontrast administration

Vascular: Normal arterial flow voids

Skull and upper cervical spine: Negative

Sinuses/Orbits: Negative

Other: None
IMPRESSION: Mild atrophy and mild chronic microvascular ischemia. No acute
abnormality.

## 2019-11-06 DIAGNOSIS — H903 Sensorineural hearing loss, bilateral: Secondary | ICD-10-CM | POA: Diagnosis not present

## 2019-11-11 DIAGNOSIS — M810 Age-related osteoporosis without current pathological fracture: Secondary | ICD-10-CM | POA: Diagnosis not present

## 2019-11-11 DIAGNOSIS — Z1231 Encounter for screening mammogram for malignant neoplasm of breast: Secondary | ICD-10-CM | POA: Diagnosis not present

## 2019-11-26 DIAGNOSIS — E559 Vitamin D deficiency, unspecified: Secondary | ICD-10-CM | POA: Diagnosis not present

## 2019-11-26 DIAGNOSIS — I10 Essential (primary) hypertension: Secondary | ICD-10-CM | POA: Diagnosis not present

## 2019-11-26 DIAGNOSIS — E782 Mixed hyperlipidemia: Secondary | ICD-10-CM | POA: Diagnosis not present

## 2019-12-02 DIAGNOSIS — R69 Illness, unspecified: Secondary | ICD-10-CM | POA: Diagnosis not present

## 2020-02-17 ENCOUNTER — Other Ambulatory Visit: Payer: Self-pay | Admitting: Family Medicine

## 2020-02-17 DIAGNOSIS — I10 Essential (primary) hypertension: Secondary | ICD-10-CM

## 2020-02-17 DIAGNOSIS — E782 Mixed hyperlipidemia: Secondary | ICD-10-CM

## 2020-02-17 DIAGNOSIS — Z9189 Other specified personal risk factors, not elsewhere classified: Secondary | ICD-10-CM

## 2020-03-11 ENCOUNTER — Ambulatory Visit
Admission: RE | Admit: 2020-03-11 | Discharge: 2020-03-11 | Disposition: A | Discharge status: Home / Self Care | Payer: Medicare HMO | Source: Ambulatory Visit | Attending: Family Medicine | Admitting: Family Medicine

## 2020-03-11 DIAGNOSIS — Z9189 Other specified personal risk factors, not elsewhere classified: Secondary | ICD-10-CM

## 2020-03-11 DIAGNOSIS — E782 Mixed hyperlipidemia: Secondary | ICD-10-CM

## 2020-03-11 DIAGNOSIS — I10 Essential (primary) hypertension: Secondary | ICD-10-CM

## 2021-08-02 DIAGNOSIS — L989 Disorder of the skin and subcutaneous tissue, unspecified: Secondary | ICD-10-CM | POA: Diagnosis not present

## 2021-08-03 DIAGNOSIS — L905 Scar conditions and fibrosis of skin: Secondary | ICD-10-CM | POA: Diagnosis not present

## 2021-08-03 DIAGNOSIS — L718 Other rosacea: Secondary | ICD-10-CM | POA: Diagnosis not present

## 2021-08-03 DIAGNOSIS — D485 Neoplasm of uncertain behavior of skin: Secondary | ICD-10-CM | POA: Diagnosis not present

## 2021-08-24 DIAGNOSIS — H5203 Hypermetropia, bilateral: Secondary | ICD-10-CM | POA: Diagnosis not present

## 2021-09-06 DIAGNOSIS — G3184 Mild cognitive impairment, so stated: Secondary | ICD-10-CM | POA: Diagnosis not present

## 2021-09-06 DIAGNOSIS — F809 Developmental disorder of speech and language, unspecified: Secondary | ICD-10-CM | POA: Diagnosis not present

## 2021-10-02 ENCOUNTER — Encounter: Payer: Self-pay | Admitting: *Deleted

## 2021-10-16 ENCOUNTER — Ambulatory Visit: Payer: Medicare Other | Attending: Neurology | Admitting: Speech Pathology

## 2021-10-16 DIAGNOSIS — R41841 Cognitive communication deficit: Secondary | ICD-10-CM

## 2021-10-16 DIAGNOSIS — R4701 Aphasia: Secondary | ICD-10-CM | POA: Diagnosis not present

## 2021-10-16 NOTE — Therapy (Unsigned)
OUTPATIENT SPEECH LANGUAGE PATHOLOGY EVALUATION   Patient Name: Melinda Kelly MRN: [id number] DOB:[birth year], 76 y.o., female Today's Date: 10/16/2021  PCP: Leamon Arnt, MD REFERRING PROVIDER: Gwendolyn Fill , [place]   End of Session - 10/16/21 1225     Visit Number 1    Number of Visits 25    Date for SLP Re-Evaluation 01/08/22    Authorization Type [employer] Medicare    Progress Note Due on Visit 10    SLP Start Time 36    SLP Stop Time  1315    SLP Time Calculation (min) 49 min    Activity Tolerance Patient tolerated treatment well             Past Medical History:  Diagnosis Date   Dyslipidemia    HTN (hypertension)    Memory disorder 07/01/2018   Seeing [facility].  MoCa 18/30 12/2017 Brain MRI: sm vessel disease. Apraxia and aphasia   Past Surgical History:  Procedure Laterality Date   BREAST LUMPECTOMY     TANDEM AND OVOID INSERTION     Patient Active Problem List   Diagnosis Date Noted   Memory disorder 07/01/2018   Right leg pain 07/01/2018   Age-related osteoporosis without current pathological fracture 07/01/2018   Mixed hyperlipidemia 03/16/2009   Essential hypertension 03/16/2009    ONSET DATE: 10-02-21 referral    REFERRING DIAG:  G31.84 (ICD-10-CM) - Mild cognitive impairment of uncertain or unknown etiology  F80.9 (ICD-10-CM) - Developmental disorder of speech and language, unspecified    THERAPY DIAG:  Cognitive communication deficit  Aphasia  Rationale for Evaluation and Treatment Rehabilitation  SUBJECTIVE:   SUBJECTIVE STATEMENT: "I can't talk.Marland Kitchen at all"  Pt accompanied by: significant other  PERTINENT HISTORY: followed dy [facility], dx with MCI. Most significant presentation is with impaired verbal language, prior ST course at this clinic 2020.   PAIN:  Are you having pain? No   FALLS: Has patient fallen in last 6 months?  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 Although her cognitive testing score, which suggest dementia, she is still independent with IADLs and ADLs with the exception of driving distances. Her history of anxiety and high blood pressure likely contribut to the symptoms as well, but do not fully explain her symptoms. A neurodegenerative process is still being considered as a cause of her language difficulties in the setting of a family history of Alzheimer disease dementia. The logopenic variant primary progressive aphasia (IVPPA) is being considered due to her word finding difficulties, along with difficulties with sentence repetition. IVPPA is considered area to Alzheimer disease dementia. MRI of the brain did note mild biparietal lobe atrophy, as well as hippocampal atrophy noted, right greater than left, as well as mild left anterior temporal lobe atrophy noted. Her MRI did not show significant atrophy of the anterior left temporal lobe or left peritonsillar area atrophy, making other primary progressive aphasia less likely. A genetic etiology is considered unlikely."  COGNITION: Overall cognitive status: Impaired Areas of impairment:  Memory: {SLPmemory:27206} Functional deficits: ***  COGNITIVE COMMUNICATION  Following directions: Follows one step commands inconsistently  Auditory comprehension: Impaired: *** Verbal expression: Impaired: *** Functional communication: {WFL-Impaired:25365}  READING COMPREHENSION: {SLPreadingcomprehension:27140}  EXPRESSION: {SLP EXPRESSION:25433}  VERBAL  EXPRESSION: Level of generative/spontaneous verbalization: {SLP level of generative/spontaneious verbalization:25435} Automatic speech: {SLP ATOMIC SPEECH:25434}  Repetition: {SLPrepetion:27212} Naming: {SLPnaming:27214} Pragmatics: {slppragmatics:27216} Comments: *** Interfering components: {SLP INTERFERING COMPONENTS:25436} Effective technique: {SLP EFFECTIVE TECHNIQUE:25437} Non-verbal means of communication: {SLP non verbal means of communication:25438}  WRITTEN EXPRESSION: Dominant hand: {RIGHT/LEFT:20294}  Written expression: Impaired: {SLPwrittenepression:27208}  ORAL MOTOR EXAMINATION Overall status: {OMESLP2:27645} Comments: ***  STANDARDIZED ASSESSMENTS: {SLPstandardizedassessment:27092}   PATIENT REPORTED OUTCOME MEASURES (PROM): {SLPPROM:27095}   TODAY'S TREATMENT:  ***   PATIENT EDUCATION: Education details: *** Person educated: {Person educated:25204} Education method: {Education Method:25205} Education comprehension: {Education Comprehension:25206}     GOALS: Goals reviewed with patient? {yes/no:20286}  SHORT TERM GOALS: Target date: {follow up:25551}  (Remove Blue Hyperlink)  *** Baseline: Goal status: {GOALSTATUS:25110}  2.  *** Baseline:  Goal status: {GOALSTATUS:25110}  3.  *** Baseline:  Goal status: {GOALSTATUS:25110}  4.  *** Baseline:  Goal status: {GOALSTATUS:25110}  5.  *** Baseline:  Goal status: {GOALSTATUS:25110}  6.  *** Baseline:  Goal status: {GOALSTATUS:25110}  LONG TERM GOALS: Target date: {follow up:25551}  (Remove Blue Hyperlink)  *** Baseline:  Goal status: {GOALSTATUS:25110}  2.  *** Baseline:  Goal status: {GOALSTATUS:25110}  3.  *** Baseline:  Goal status: {GOALSTATUS:25110}  4.  *** Baseline:  Goal status: {GOALSTATUS:25110}  5.  *** Baseline:  Goal status: {GOALSTATUS:25110}  6.  *** Baseline:  Goal status: {GOALSTATUS:25110}  ASSESSMENT:  CLINICAL IMPRESSION: Patient is a ***  y.o. *** who was seen today for ***.   OBJECTIVE IMPAIRMENTS include {SLPOBJIMP:27107}. These impairments are limiting patient from {SLPLIMIT:27108}. Factors affecting potential to achieve goals and functional outcome are {SLP factors:25450}.. Patient will benefit from skilled SLP services to address above impairments and improve overall function.  REHAB POTENTIAL: {rehabpotential:25112}  PLAN: SLP FREQUENCY: {rehab frequency:25116}  SLP DURATION: {rehab duration:25117}  PLANNED INTERVENTIONS: {SLP treatment/interventions:25449}    Su Monks, CCC-SLP 10/16/2021, 12:54 PM

## 2021-10-17 ENCOUNTER — Encounter: Payer: Self-pay | Admitting: Speech Pathology

## 2021-10-23 ENCOUNTER — Ambulatory Visit: Payer: Medicare Other | Admitting: Speech Pathology

## 2021-10-23 DIAGNOSIS — R4701 Aphasia: Secondary | ICD-10-CM | POA: Diagnosis not present

## 2021-10-23 DIAGNOSIS — R41841 Cognitive communication deficit: Secondary | ICD-10-CM | POA: Diagnosis not present

## 2021-10-23 NOTE — Patient Instructions (Signed)
I go to [employer], off of [address].  I do the dance classes. I like the music and it's a lot of fun. The other dancers are my age.   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   "Remind me your name again"  "Can you tell me your name?"   They say their name Say their name back to them -- oh yes, [name], it's great to see you [name]!

## 2021-10-23 NOTE — Therapy (Signed)
OUTPATIENT SPEECH LANGUAGE PATHOLOGY EVALUATION   Patient Name: Melinda Kelly MRN: [id number] DOB:05/08/1945, 76 y.o., female Today's Date: 10/23/2021  PCP: Leamon Arnt, MD REFERRING PROVIDER: Gwendolyn Fill , [place]   End of Session - 10/23/21 1444     Visit Number 2    Number of Visits 25    Date for SLP Re-Evaluation 01/08/22    Authorization Type [employer] Medicare    Progress Note Due on Visit 10    SLP Start Time 1445    SLP Stop Time  1530    SLP Time Calculation (min) 45 min    Activity Tolerance Patient tolerated treatment well             Past Medical History:  Diagnosis Date   Dyslipidemia    HTN (hypertension)    Memory disorder 07/01/2018   Seeing [facility].  MoCa 18/30 12/2017 Brain MRI: sm vessel disease. Apraxia and aphasia   Past Surgical History:  Procedure Laterality Date   BREAST LUMPECTOMY     TANDEM AND OVOID INSERTION     Patient Active Problem List   Diagnosis Date Noted   Memory disorder 07/01/2018   Right leg pain 07/01/2018   Age-related osteoporosis without current pathological fracture 07/01/2018   Mixed hyperlipidemia 03/16/2009   Essential hypertension 03/16/2009    ONSET DATE: 10-02-21 referral    REFERRING DIAG:  G31.84 (ICD-10-CM) - Mild cognitive impairment of uncertain or unknown etiology  F80.9 (ICD-10-CM) - Developmental disorder of speech and language, unspecified    THERAPY DIAG:  Cognitive communication deficit  Aphasia  Rationale for Evaluation and Treatment Rehabilitation  SUBJECTIVE:   SUBJECTIVE STATEMENT: "The doctor said I should get a card"   PAIN:  Are you having pain? No   OBJECTIVE:   TODAY'S TREATMENT:  10-23-21: Provided pt with aphasia ID cards, education on aphasia as term for difficulty finding words, despite not being formally dx with primary progressive aphasia. Pt and spouse in agreement re: use of this terminology. Education on benefit of self-disclosure to enable  communication with peers at gym. Pt reports feeling self-conscious re: communication difficulties. Generated short script which pt able to demonstrate with visual cue x3. Pt desires to recall names of classmates. Generated script for asking for name and recall strategy of using name, having husband reinforce, and writing down name + salient details following to aid in recall. Pt verbalizes appreciation for interventions provided this date.   PATIENT EDUCATION: Education details: see above Person educated: Patient and Spouse Education method: Explanation, Demonstration, Verbal cues, and Handouts Education comprehension: verbalized understanding, returned demonstration, verbal cues required, and needs further education  GOALS: Goals reviewed with patient? Yes  SHORT TERM GOALS: Target date: 11/20/2021  Pt will name 3+ items across 3 personally relevant categories with occasional min-A Baseline: Goal status: IN PROGRESS  2.  Pt will complete structured language tasks with 60% accuracy with occasional mod-A over 2 sessions Baseline:  Goal status: INITIAL  3.  Pt will use word finding compensations in 5 minute personally relevant discourse sample with occasional min-A Baseline:  Goal status: INITIAL  4.  Pt will report daily HEP for aphasia and agraphia with mod-I Baseline:  Goal status: INITIAL   LONG TERM GOALS: Target date: 01/08/2021  Pt will order her own food at restaurant, following script training practice, with use of compensations as needed Baseline:  Goal status: INITIAL  2.  Pt will report carryover of targeted strategies resulting in x3 instances  of speaking to fellow gym members over 1 week period Baseline:  Goal status: INITIAL  3.  Pt will complete structured language tasks with 80% accuracy with occasional mod-A over 2 sessions Baseline:  Goal status: INITIAL  4.  Spouse will demonstrate supportive communication techniques to aid in successful determination of  preferences, topic identification, or thoughts at home Baseline:  Goal status: INITIAL  5.  Pt will use multimodal communication to relay preferences or thoughts regarding personally relevant topic during structured tasks in 70% of opportunities  over 2 sessions Baseline:  Goal status: INITIAL  ASSESSMENT:  CLINICAL IMPRESSION: Patient is a 76 y.o. F who was seen today for cognitive linguistic evaluation in precense of mild cognitive impairment with questionable underlying neurodegenerative disease. Moderate expressive aphasia, mild impairment of receptive language, accompanying mild to moderate dysgraphia. Pt primarily uses verbal speech to communicate, which is notable for usual vague language, anomia, and dysnomia (mixed paraphasias, with intermittent awareness). Ongoing difficulties with anomia/dysnomia reported since 2019, though pt and spouse endorse overall abilities have remained stable. Expressive language impairment noted in naming, automatic speech, and simple conversation, to include pt's personal experiences. Given verbal cues, pt able to use gestures, writing, and drawing to aid in anomia throughout session. Dysgraphia reported and evidenced in writing what she had for lunch and in writing her name. Pt and spouse report communication is overall going well at home, and primary strategy in use is use of questions and determining topic to aid in understanding on husband's part.  Mild receptive language impairment in which pt required usual repetition and direct, simple language with occasional written supports to aid in comprehension. Pt is IND using strategy of asking for repetition when needed. Pt and spouse support cognitive changes in memory but report is not negatively impacting participation in home based of avocational activities at this time. SLP to monitor and will update plan of care should this area become a concern for pt or spouse. Pt primary concern at this time is her  communication, reporting she is avoiding talking to all but select few people and desires to increase amount of people she feels comfortable communicating with. SLP recommends skilled ST interventions to enable effective use of compensatory techniques to assist with verbal communication. Additionally, pt may benefit from implementation of augmentative or alternative communication system to support verbal speech in light of potential neurodegenerative etiology, will monitor and assess for suitability and interest over therapy course.   OBJECTIVE IMPAIRMENTS include memory, expressive language, aphasia, and apraxia. These impairments are limiting patient from ADLs/IADLs and effectively communicating at home and in community. Factors affecting potential to achieve goals and functional outcome are ability to learn/carryover information, medical prognosis, previous level of function, and severity of impairments. Patient will benefit from skilled SLP services to address above impairments and improve overall function.  REHAB POTENTIAL: Fair (prognosis ? neurodegenerative disease)  PLAN: SLP FREQUENCY: 2x/week (pt elects for initiation of therapy at 1x week at this time)  SLP DURATION: 12 weeks  PLANNED INTERVENTIONS: Language facilitation, Cueing hierachy, Cognitive reorganization, Internal/external aids, Functional tasks, Multimodal communication approach, SLP instruction and feedback, Compensatory strategies, and Patient/family education    Su Monks, CCC-SLP 10/23/2021, 2:45 PM

## 2021-10-27 DIAGNOSIS — E782 Mixed hyperlipidemia: Secondary | ICD-10-CM | POA: Diagnosis not present

## 2021-10-27 DIAGNOSIS — M810 Age-related osteoporosis without current pathological fracture: Secondary | ICD-10-CM | POA: Diagnosis not present

## 2021-10-27 DIAGNOSIS — I10 Essential (primary) hypertension: Secondary | ICD-10-CM | POA: Diagnosis not present

## 2021-10-27 DIAGNOSIS — Z0000 Encounter for general adult medical examination without abnormal findings: Secondary | ICD-10-CM | POA: Diagnosis not present

## 2021-10-31 DIAGNOSIS — H18413 Arcus senilis, bilateral: Secondary | ICD-10-CM | POA: Diagnosis not present

## 2021-10-31 DIAGNOSIS — H04123 Dry eye syndrome of bilateral lacrimal glands: Secondary | ICD-10-CM | POA: Diagnosis not present

## 2021-10-31 DIAGNOSIS — H2513 Age-related nuclear cataract, bilateral: Secondary | ICD-10-CM | POA: Diagnosis not present

## 2021-10-31 DIAGNOSIS — H25043 Posterior subcapsular polar age-related cataract, bilateral: Secondary | ICD-10-CM | POA: Diagnosis not present

## 2021-11-01 ENCOUNTER — Ambulatory Visit: Payer: Medicare Other | Admitting: Speech Pathology

## 2021-11-01 DIAGNOSIS — R41841 Cognitive communication deficit: Secondary | ICD-10-CM | POA: Diagnosis not present

## 2021-11-01 DIAGNOSIS — R4701 Aphasia: Secondary | ICD-10-CM

## 2021-11-01 NOTE — Therapy (Signed)
OUTPATIENT SPEECH LANGUAGE PATHOLOGY EVALUATION   Patient Name: Melinda Kelly MRN: [id number] DOB:1945/08/27, 76 y.o., female Today's Date: 11/01/2021  PCP: Leamon Arnt, MD REFERRING PROVIDER: Gwendolyn Fill , [place]   End of Session - 11/01/21 1531     Visit Number 3    Number of Visits 25    Date for SLP Re-Evaluation 01/08/22    Authorization Type [employer] Medicare    Progress Note Due on Visit 10    SLP Start Time 1531    SLP Stop Time  1615    SLP Time Calculation (min) 44 min    Activity Tolerance Patient tolerated treatment well             Past Medical History:  Diagnosis Date   Dyslipidemia    HTN (hypertension)    Memory disorder 07/01/2018   Seeing [facility].  MoCa 18/30 12/2017 Brain MRI: sm vessel disease. Apraxia and aphasia   Past Surgical History:  Procedure Laterality Date   BREAST LUMPECTOMY     TANDEM AND OVOID INSERTION     Patient Active Problem List   Diagnosis Date Noted   Memory disorder 07/01/2018   Right leg pain 07/01/2018   Age-related osteoporosis without current pathological fracture 07/01/2018   Mixed hyperlipidemia 03/16/2009   Essential hypertension 03/16/2009    ONSET DATE: 10-02-21 referral    REFERRING DIAG:  G31.84 (ICD-10-CM) - Mild cognitive impairment of uncertain or unknown etiology  F80.9 (ICD-10-CM) - Developmental disorder of speech and language, unspecified    THERAPY DIAG:  Cognitive communication deficit  Aphasia  Rationale for Evaluation and Treatment Rehabilitation  SUBJECTIVE:   SUBJECTIVE STATEMENT: "I did my homework"   PAIN:  Are you having pain? No   OBJECTIVE:   TODAY'S TREATMENT:  11-01-21: Pt reports carryover of scripts generated previous therapy session. Greeted fitness instructions and several classmates, utilized self-disclosure, and used association strategy to successfully recall SLP's name. Verbalizes appreciation for strategies, saying they were "very helpful."  Provided handout to aid in recall of important names. Education on taking time to get thoughts out and generated several scripts pt can use to request more time. Pt demonstrates with mod-I x3 across therapy session when husband attempts to help during anomia. Husband very receptive to waiting, demonstration of supportive communication techniques of cueing, honoring requests, and providing extended time.   10-23-21: Provided pt with aphasia ID cards, education on aphasia as term for difficulty finding words, despite not being formally dx with primary progressive aphasia. Pt and spouse in agreement re: use of this terminology. Education on benefit of self-disclosure to enable communication with peers at gym. Pt reports feeling self-conscious re: communication difficulties. Generated short script which pt able to demonstrate with visual cue x3. Pt desires to recall names of classmates. Generated script for asking for name and recall strategy of using name, having husband reinforce, and writing down name + salient details following to aid in recall. Pt verbalizes appreciation for interventions provided this date.   PATIENT EDUCATION: Education details: see above Person educated: Patient and Spouse Education method: Explanation, Demonstration, Verbal cues, and Handouts Education comprehension: verbalized understanding, returned demonstration, verbal cues required, and needs further education  GOALS: Goals reviewed with patient? Yes  SHORT TERM GOALS: Target date: 11/20/2021  Pt will name 3+ items across 3 personally relevant categories with occasional min-A Baseline: Goal status: IN PROGRESS  2.  Pt will complete structured language tasks with 60% accuracy with occasional mod-A over 2  sessions Baseline:  Goal status: INITIAL  3.  Pt will use word finding compensations in 5 minute personally relevant discourse sample with occasional min-A Baseline:  Goal status: INITIAL  4.  Pt will report daily  HEP for aphasia and agraphia with mod-I Baseline:  Goal status: INITIAL   LONG TERM GOALS: Target date: 01/08/2021  Pt will order her own food at restaurant, following script training practice, with use of compensations as needed Baseline:  Goal status: INITIAL  2.  Pt will report carryover of targeted strategies resulting in x3 instances of speaking to fellow gym members over 1 week period Baseline:  Goal status: INITIAL  3.  Pt will complete structured language tasks with 80% accuracy with occasional mod-A over 2 sessions Baseline:  Goal status: INITIAL  4.  Spouse will demonstrate supportive communication techniques to aid in successful determination of preferences, topic identification, or thoughts at home Baseline:  Goal status: INITIAL  5.  Pt will use multimodal communication to relay preferences or thoughts regarding personally relevant topic during structured tasks in 70% of opportunities  over 2 sessions Baseline:  Goal status: INITIAL  ASSESSMENT:  CLINICAL IMPRESSION: Patient is a 76 y.o. F who was seen today for cognitive linguistic evaluation in precense of mild cognitive impairment with questionable underlying neurodegenerative disease. Moderate expressive aphasia, mild impairment of receptive language, accompanying mild to moderate dysgraphia. Pt primarily uses verbal speech to communicate, which is notable for usual vague language, anomia, and dysnomia (mixed paraphasias, with intermittent awareness). Ongoing difficulties with anomia/dysnomia reported since 2019, though pt and spouse endorse overall abilities have remained stable. Expressive language impairment noted in naming, automatic speech, and simple conversation, to include pt's personal experiences. Given verbal cues, pt able to use gestures, writing, and drawing to aid in anomia throughout session. Dysgraphia reported and evidenced in writing what she had for lunch and in writing her name. Pt and spouse report  communication is overall going well at home, and primary strategy in use is use of questions and determining topic to aid in understanding on husband's part.  Mild receptive language impairment in which pt required usual repetition and direct, simple language with occasional written supports to aid in comprehension. Pt is IND using strategy of asking for repetition when needed. Pt and spouse support cognitive changes in memory but report is not negatively impacting participation in home based of avocational activities at this time. SLP to monitor and will update plan of care should this area become a concern for pt or spouse. Pt primary concern at this time is her communication, reporting she is avoiding talking to all but select few people and desires to increase amount of people she feels comfortable communicating with. SLP recommends skilled ST interventions to enable effective use of compensatory techniques to assist with verbal communication. Additionally, pt may benefit from implementation of augmentative or alternative communication system to support verbal speech in light of potential neurodegenerative etiology, will monitor and assess for suitability and interest over therapy course.   OBJECTIVE IMPAIRMENTS include memory, expressive language, aphasia, and apraxia. These impairments are limiting patient from ADLs/IADLs and effectively communicating at home and in community. Factors affecting potential to achieve goals and functional outcome are ability to learn/carryover information, medical prognosis, previous level of function, and severity of impairments. Patient will benefit from skilled SLP services to address above impairments and improve overall function.  REHAB POTENTIAL: Fair (prognosis ? neurodegenerative disease)  PLAN: SLP FREQUENCY: 2x/week (pt elects for initiation of therapy at  1x week at this time)  SLP DURATION: 12 weeks  PLANNED INTERVENTIONS: Language facilitation, Cueing  hierachy, Cognitive reorganization, Internal/external aids, Functional tasks, Multimodal communication approach, SLP instruction and feedback, Compensatory strategies, and Patient/family education    Su Monks, CCC-SLP 11/01/2021, 3:31 PM

## 2021-11-01 NOTE — Patient Instructions (Addendum)
Manuela Schwartz   We have a lot going on. I'll let you know when our schedule frees up.     People from the gym: Name: About them:  Connection to remind you of name

## 2021-11-08 ENCOUNTER — Encounter: Payer: No Typology Code available for payment source | Admitting: Speech Pathology

## 2021-11-09 ENCOUNTER — Ambulatory Visit: Payer: Medicare Other | Attending: Neurology | Admitting: Speech Pathology

## 2021-11-09 DIAGNOSIS — E782 Mixed hyperlipidemia: Secondary | ICD-10-CM | POA: Diagnosis not present

## 2021-11-09 DIAGNOSIS — R4701 Aphasia: Secondary | ICD-10-CM

## 2021-11-09 DIAGNOSIS — R41841 Cognitive communication deficit: Secondary | ICD-10-CM | POA: Diagnosis not present

## 2021-11-09 DIAGNOSIS — I10 Essential (primary) hypertension: Secondary | ICD-10-CM | POA: Diagnosis not present

## 2021-11-09 DIAGNOSIS — M810 Age-related osteoporosis without current pathological fracture: Secondary | ICD-10-CM | POA: Diagnosis not present

## 2021-11-09 NOTE — Therapy (Signed)
OUTPATIENT SPEECH LANGUAGE PATHOLOGY EVALUATION   Patient Name: Melinda Kelly MRN: [id number] DOB:1945-08-26, 76 y.o., female Today's Date: 11/09/2021  PCP: Leamon Arnt, MD REFERRING PROVIDER: Gwendolyn Fill , PA   End of Session - 11/09/21 1357     Visit Number 4    Number of Visits 25    Date for SLP Re-Evaluation 01/08/22    Authorization Type [employer]    Progress Note Due on Visit 10    SLP Start Time 1400    SLP Stop Time  1445    SLP Time Calculation (min) 45 min    Activity Tolerance Patient tolerated treatment well             Past Medical History:  Diagnosis Date   Dyslipidemia    HTN (hypertension)    Memory disorder 07/01/2018   Seeing [facility].  MoCa 18/30 12/2017 Brain MRI: sm vessel disease. Apraxia and aphasia   Past Surgical History:  Procedure Laterality Date   BREAST LUMPECTOMY     TANDEM AND OVOID INSERTION     Patient Active Problem List   Diagnosis Date Noted   Memory disorder 07/01/2018   Right leg pain 07/01/2018   Age-related osteoporosis without current pathological fracture 07/01/2018   Mixed hyperlipidemia 03/16/2009   Essential hypertension 03/16/2009    ONSET DATE: 10-02-21 referral    REFERRING DIAG:  G31.84 (ICD-10-CM) - Mild cognitive impairment of uncertain or unknown etiology  F80.9 (ICD-10-CM) - Developmental disorder of speech and language, unspecified    THERAPY DIAG:  Cognitive communication deficit  Aphasia  Rationale for Evaluation and Treatment Rehabilitation  SUBJECTIVE:   SUBJECTIVE STATEMENT: Pt reports completion of HEP, has aided in recall of names for gym members    PAIN:  Are you having pain? No   OBJECTIVE:   TODAY'S TREATMENT:  11-09-21: Pt was able to carryover scripts generated last session and had successful visit with friend. Pt with spontaneous use of description to aid in recovery when anomia occurs. SLP provided education on using intentional circumlocution  as strategy to facilitate cohesive discourse and avoid communication breakdown. SLP provided prompts to aid in description ala semantic feature analysis. Following, pt able to generate 3+ features for x7 targets with usual use of questioning cues. Provided written handout to aid in carryover of strategy at home. Updated HEP.   11-01-21: Pt reports carryover of scripts generated previous therapy session. Greeted fitness instructions and several classmates, utilized self-disclosure, and used association strategy to successfully recall SLP's name. Verbalizes appreciation for strategies, saying they were "very helpful." Provided handout to aid in recall of important names. Education on taking time to get thoughts out and generated several scripts pt can use to request more time. Pt demonstrates with mod-I x3 across therapy session when husband attempts to help during anomia. Husband very receptive to waiting, demonstration of supportive communication techniques of cueing, honoring requests, and providing extended time.   10-23-21: Provided pt with aphasia ID cards, education on aphasia as term for difficulty finding words, despite not being formally dx with primary progressive aphasia. Pt and spouse in agreement re: use of this terminology. Education on benefit of self-disclosure to enable communication with peers at gym. Pt reports feeling self-conscious re: communication difficulties. Generated short script which pt able to demonstrate with visual cue x3. Pt desires to recall names of classmates. Generated script for asking for name and recall strategy of using name, having husband reinforce, and writing down name +  salient details following to aid in recall. Pt verbalizes appreciation for interventions provided this date.   PATIENT EDUCATION: Education details: see above Person educated: Patient and Spouse Education method: Explanation, Demonstration, Verbal cues, and Handouts Education comprehension:  verbalized understanding, returned demonstration, verbal cues required, and needs further education  GOALS: Goals reviewed with patient? Yes  SHORT TERM GOALS: Target date: 11/20/2021  Pt will name 3+ items across 3 personally relevant categories with occasional min-A Baseline: Goal status: IN PROGRESS  2.  Pt will complete structured language tasks with 60% accuracy with occasional mod-A over 2 sessions Baseline:  Goal status: INITIAL  3.  Pt will use word finding compensations in 5 minute personally relevant discourse sample with occasional min-A Baseline:  Goal status: INITIAL  4.  Pt will report daily HEP for aphasia and agraphia with mod-I Baseline:  Goal status: INITIAL   LONG TERM GOALS: Target date: 01/08/2021  Pt will order her own food at restaurant, following script training practice, with use of compensations as needed Baseline:  Goal status: INITIAL  2.  Pt will report carryover of targeted strategies resulting in x3 instances of speaking to fellow gym members over 1 week period Baseline:  Goal status: INITIAL  3.  Pt will complete structured language tasks with 80% accuracy with occasional mod-A over 2 sessions Baseline:  Goal status: INITIAL  4.  Spouse will demonstrate supportive communication techniques to aid in successful determination of preferences, topic identification, or thoughts at home Baseline:  Goal status: INITIAL  5.  Pt will use multimodal communication to relay preferences or thoughts regarding personally relevant topic during structured tasks in 70% of opportunities  over 2 sessions Baseline:  Goal status: INITIAL  ASSESSMENT:  CLINICAL IMPRESSION: Patient is a 76 y.o. F who was seen today for cognitive linguistic evaluation in precense of mild cognitive impairment with questionable underlying neurodegenerative disease. Moderate expressive aphasia, mild impairment of receptive language, accompanying mild to moderate dysgraphia. Pt  primarily uses verbal speech to communicate, which is notable for usual vague language, anomia, and dysnomia (mixed paraphasias, with intermittent awareness). Ongoing difficulties with anomia/dysnomia reported since 2019, though pt and spouse endorse overall abilities have remained stable. Expressive language impairment noted in naming, automatic speech, and simple conversation, to include pt's personal experiences. Given verbal cues, pt able to use gestures, writing, and drawing to aid in anomia throughout session. Dysgraphia reported and evidenced in writing what she had for lunch and in writing her name. Pt and spouse report communication is overall going well at home, and primary strategy in use is use of questions and determining topic to aid in understanding on husband's part.  Mild receptive language impairment in which pt required usual repetition and direct, simple language with occasional written supports to aid in comprehension. Pt is IND using strategy of asking for repetition when needed. Pt and spouse support cognitive changes in memory but report is not negatively impacting participation in home based of avocational activities at this time. SLP to monitor and will update plan of care should this area become a concern for pt or spouse. Pt primary concern at this time is her communication, reporting she is avoiding talking to all but select few people and desires to increase amount of people she feels comfortable communicating with. SLP recommends skilled ST interventions to enable effective use of compensatory techniques to assist with verbal communication. Additionally, pt may benefit from implementation of augmentative or alternative communication system to support verbal speech in light of  potential neurodegenerative etiology, will monitor and assess for suitability and interest over therapy course.   OBJECTIVE IMPAIRMENTS include memory, expressive language, aphasia, and apraxia. These  impairments are limiting patient from ADLs/IADLs and effectively communicating at home and in community. Factors affecting potential to achieve goals and functional outcome are ability to learn/carryover information, medical prognosis, previous level of function, and severity of impairments. Patient will benefit from skilled SLP services to address above impairments and improve overall function.  REHAB POTENTIAL: Fair (prognosis ? neurodegenerative disease)  PLAN: SLP FREQUENCY: 2x/week (pt elects for initiation of therapy at 1x week at this time)  SLP DURATION: 12 weeks  PLANNED INTERVENTIONS: Language facilitation, Cueing hierachy, Cognitive reorganization, Internal/external aids, Functional tasks, Multimodal communication approach, SLP instruction and feedback, Compensatory strategies, and Patient/family education    Su Monks, CCC-SLP 11/09/2021, 1:57 PM

## 2021-11-10 DIAGNOSIS — Z1212 Encounter for screening for malignant neoplasm of rectum: Secondary | ICD-10-CM | POA: Diagnosis not present

## 2021-11-10 DIAGNOSIS — Z1211 Encounter for screening for malignant neoplasm of colon: Secondary | ICD-10-CM | POA: Diagnosis not present

## 2021-11-15 ENCOUNTER — Ambulatory Visit: Payer: Medicare Other | Admitting: Speech Pathology

## 2021-11-15 DIAGNOSIS — R4701 Aphasia: Secondary | ICD-10-CM

## 2021-11-15 DIAGNOSIS — R41841 Cognitive communication deficit: Secondary | ICD-10-CM | POA: Diagnosis not present

## 2021-11-15 NOTE — Therapy (Signed)
OUTPATIENT SPEECH LANGUAGE PATHOLOGY EVALUATION   Patient Name: Melinda Kelly MRN: [id number] DOB:1945/04/01, 76 y.o., female Today's Date: 11/15/2021  PCP: Leamon Arnt, MD REFERRING PROVIDER: Gwendolyn Fill , PA   End of Session - 11/15/21 1229     Visit Number 5    Number of Visits 25    Date for SLP Re-Evaluation 01/08/22    Authorization Type [employer]    Progress Note Due on Visit 10    SLP Start Time 1229    SLP Stop Time  1311    SLP Time Calculation (min) 42 min    Activity Tolerance Patient tolerated treatment well             Past Medical History:  Diagnosis Date   Dyslipidemia    HTN (hypertension)    Memory disorder 07/01/2018   [facility]ing [facility].  MoCa 18/30 12/2017 Brain MRI: sm vessel disease. Apraxia and aphasia   Past Surgical History:  Procedure Laterality Date   BREAST LUMPECTOMY     TANDEM AND OVOID INSERTION     Patient Active Problem List   Diagnosis Date Noted   Memory disorder 07/01/2018   Right leg pain 07/01/2018   Age-related osteoporosis without current pathological fracture 07/01/2018   Mixed hyperlipidemia 03/16/2009   Essential hypertension 03/16/2009    ONSET DATE: 10-02-21 referral    REFERRING DIAG:  G31.84 (ICD-10-CM) - Mild cognitive impairment of uncertain or unknown etiology  F80.9 (ICD-10-CM) - Developmental disorder of speech and language, unspecified    THERAPY DIAG:  Cognitive communication deficit  Aphasia  Rationale for Evaluation and Treatment Rehabilitation  SUBJECTIVE:   SUBJECTIVE STATEMENT: "We went to church on Sunday"     PAIN:  Are you having pain? No   OBJECTIVE:   TODAY'S TREATMENT:  11-15-21: Pt reports to going to church in person and attended a fellowship event after. Reports did not engage much, was overwhelming. Will be going to party this weekend with new people. Generated simple scripts pt can use in small talk conversations. Given visual aid, pt able to  verbally produce each x3 with occasional min-A to correct mixed paraphasias. Re-education on using description to aid in anomia with pt requiring max cues to describe x2 when anomia occurred in conversation. Pt most benefits from extended time and A from listener to aid in clarity of message.   11-09-21: Pt was able to carryover scripts generated last session and had successful visit with friend. Pt with spontaneous use of description to aid in recovery when anomia occurs. SLP provided education on using intentional circumlocution as strategy to facilitate cohesive discourse and avoid communication breakdown. SLP provided prompts to aid in description ala semantic feature analysis. Following, pt able to generate 3+ features for x7 targets with usual use of questioning cues. Provided written handout to aid in carryover of strategy at home. Updated HEP.   11-01-21: Pt reports carryover of scripts generated previous therapy session. Greeted fitness instructions and several classmates, utilized self-disclosure, and used association strategy to successfully recall SLP's name. Verbalizes appreciation for strategies, saying they were "very helpful." Provided handout to aid in recall of important names. Education on taking time to get thoughts out and generated several scripts pt can use to request more time. Pt demonstrates with mod-I x3 across therapy session when husband attempts to help during anomia. Husband very receptive to waiting, demonstration of supportive communication techniques of cueing, honoring requests, and providing extended time.   10-23-21: Provided  pt with aphasia ID cards, education on aphasia as term for difficulty finding words, despite not being formally dx with primary progressive aphasia. Pt and spouse in agreement re: use of this terminology. Education on benefit of self-disclosure to enable communication with peers at gym. Pt reports feeling self-conscious re: communication difficulties.  Generated short script which pt able to demonstrate with visual cue x3. Pt desires to recall names of classmates. Generated script for asking for name and recall strategy of using name, having husband reinforce, and writing down name + salient details following to aid in recall. Pt verbalizes appreciation for interventions provided this date.   PATIENT EDUCATION: Education details: see above Person educated: Patient and Spouse Education method: Explanation, Demonstration, Verbal cues, and Handouts Education comprehension: verbalized understanding, returned demonstration, verbal cues required, and needs further education  GOALS: Goals reviewed with patient? Yes  SHORT TERM GOALS: Target date: 11/20/2021  Pt will name 3+ items across 3 personally relevant categories with occasional min-A Baseline: Goal status: IN PROGRESS  2.  Pt will complete structured language tasks with 60% accuracy with occasional mod-A over 2 sessions Baseline:  Goal status: INITIAL  3.  Pt will use word finding compensations in 5 minute personally relevant discourse sample with occasional min-A Baseline:  Goal status: INITIAL  4.  Pt will report daily HEP for aphasia and agraphia with mod-I Baseline:  Goal status: INITIAL   LONG TERM GOALS: Target date: 01/08/2021  Pt will order her own food at restaurant, following script training practice, with use of compensations as needed Baseline:  Goal status: INITIAL  2.  Pt will report carryover of targeted strategies resulting in x3 instances of speaking to fellow gym members over 1 week period Baseline:  Goal status: INITIAL  3.  Pt will complete structured language tasks with 80% accuracy with occasional mod-A over 2 sessions Baseline:  Goal status: INITIAL  4.  Spouse will demonstrate supportive communication techniques to aid in successful determination of preferences, topic identification, or thoughts at home Baseline:  Goal status: INITIAL  5.  Pt  will use multimodal communication to relay preferences or thoughts regarding personally relevant topic during structured tasks in 70% of opportunities  over 2 sessions Baseline:  Goal status: INITIAL  ASSESSMENT:  CLINICAL IMPRESSION: Patient is a 76 y.o. F who was seen today for cognitive linguistic evaluation in precense of mild cognitive impairment with questionable underlying neurodegenerative disease. Moderate expressive aphasia, mild impairment of receptive language, accompanying mild to moderate dysgraphia. Pt primarily uses verbal speech to communicate, which is notable for usual vague language, anomia, and dysnomia (mixed paraphasias, with intermittent awareness). Ongoing difficulties with anomia/dysnomia reported since 2019, though pt and spouse endorse overall abilities have remained stable. Expressive language impairment noted in naming, automatic speech, and simple conversation, to include pt's personal experiences. Given verbal cues, pt able to use gestures, writing, and drawing to aid in anomia throughout session. Dysgraphia reported and evidenced in writing what she had for lunch and in writing her name. Pt and spouse report communication is overall going well at home, and primary strategy in use is use of questions and determining topic to aid in understanding on husband's part.  Mild receptive language impairment in which pt required usual repetition and direct, simple language with occasional written supports to aid in comprehension. Pt is IND using strategy of asking for repetition when needed. Pt and spouse support cognitive changes in memory but report is not negatively impacting participation in home based of avocational  activities at this time. SLP to monitor and will update plan of care should this area become a concern for pt or spouse. Pt primary concern at this time is her communication, reporting she is avoiding talking to all but select few people and desires to increase amount  of people she feels comfortable communicating with. SLP recommends skilled ST interventions to enable effective use of compensatory techniques to assist with verbal communication. Additionally, pt may benefit from implementation of augmentative or alternative communication system to support verbal speech in light of potential neurodegenerative etiology, will monitor and assess for suitability and interest over therapy course.   OBJECTIVE IMPAIRMENTS include memory, expressive language, aphasia, and apraxia. These impairments are limiting patient from ADLs/IADLs and effectively communicating at home and in community. Factors affecting potential to achieve goals and functional outcome are ability to learn/carryover information, medical prognosis, previous level of function, and severity of impairments. Patient will benefit from skilled SLP services to address above impairments and improve overall function.  REHAB POTENTIAL: Fair (prognosis ? neurodegenerative disease)  PLAN: SLP FREQUENCY: 2x/week (pt elects for initiation of therapy at 1x week at this time)  SLP DURATION: 12 weeks  PLANNED INTERVENTIONS: Language facilitation, Cueing hierachy, Cognitive reorganization, Internal/external aids, Functional tasks, Multimodal communication approach, SLP instruction and feedback, Compensatory strategies, and Patient/family education    Su Monks, CCC-SLP 11/15/2021, 1:11 PM

## 2021-11-15 NOTE — Patient Instructions (Signed)
I have aphasia  I have trouble finding my words   We've been watching church online and just went back in person. It was wonderful.   I love going to [employer] to do classes.   I like to garden.  This is my other half, Melinda Kelly

## 2021-11-17 LAB — COLOGUARD: COLOGUARD: NEGATIVE

## 2021-11-20 ENCOUNTER — Ambulatory Visit: Payer: Medicare Other | Admitting: Speech Pathology

## 2021-11-20 DIAGNOSIS — R4701 Aphasia: Secondary | ICD-10-CM | POA: Diagnosis not present

## 2021-11-20 DIAGNOSIS — R41841 Cognitive communication deficit: Secondary | ICD-10-CM | POA: Diagnosis not present

## 2021-11-20 NOTE — Therapy (Unsigned)
OUTPATIENT SPEECH LANGUAGE PATHOLOGY EVALUATION   Patient Name: Melinda Kelly MRN: [id number] DOB:March 03, 1945, 76 y.o., female Today's Date: 11/20/2021  PCP: Leamon Arnt, MD REFERRING PROVIDER: Gwendolyn Fill , PA   End of Session - 11/20/21 1236     Visit Number 6    Number of Visits 25    Date for SLP Re-Evaluation 01/08/22    Authorization Type [employer]    Progress Note Due on Visit 10    SLP Start Time 1236    SLP Stop Time  1315    SLP Time Calculation (min) 39 min    Activity Tolerance Patient tolerated treatment well             Past Medical History:  Diagnosis Date   Dyslipidemia    HTN (hypertension)    Memory disorder 07/01/2018   Seeing [facility].  MoCa 18/30 12/2017 Brain MRI: sm vessel disease. Apraxia and aphasia   Past Surgical History:  Procedure Laterality Date   BREAST LUMPECTOMY     TANDEM AND OVOID INSERTION     Patient Active Problem List   Diagnosis Date Noted   Memory disorder 07/01/2018   Right leg pain 07/01/2018   Age-related osteoporosis without current pathological fracture 07/01/2018   Mixed hyperlipidemia 03/16/2009   Essential hypertension 03/16/2009    ONSET DATE: 10-02-21 referral    REFERRING DIAG:  G31.84 (ICD-10-CM) - Mild cognitive impairment of uncertain or unknown etiology  F80.9 (ICD-10-CM) - Developmental disorder of speech and language, unspecified    THERAPY DIAG:  Cognitive communication deficit  Aphasia  Rationale for Evaluation and Treatment Rehabilitation  SUBJECTIVE:   SUBJECTIVE STATEMENT: "The one thing I said I blundered" re: communication at party this past weekend    PAIN:  Are you having pain? No   OBJECTIVE:   TODAY'S TREATMENT:  11-20-21: Additional education provided for compensations to aid in communication confidence for the aphasic speaker. PT identified that it is more challenging to communicate when there are many people. Generated strategies to  facilitate improved communication confidence in this challenging environment, see handout. Generate list of topics pt would like to feel more confident communicating about. SLP educates on quality of interactions being more important than the actual words said. Validates being thoughtful and kind and allowing people to connect with her is just as valuable as being fluent speaker. Pt verbalizes appreciation.   11-15-21: Pt reports to going to church in person and attended a fellowship event after. Reports did not engage much, was overwhelming. Will be going to party this weekend with new people. Generated simple scripts pt can use in small talk conversations. Given visual aid, pt able to verbally produce each x3 with occasional min-A to correct mixed paraphasias. Re-education on using description to aid in anomia with pt requiring max cues to describe x2 when anomia occurred in conversation. Pt most benefits from extended time and A from listener to aid in clarity of message.   11-09-21: Pt was able to carryover scripts generated last session and had successful visit with friend. Pt with spontaneous use of description to aid in recovery when anomia occurs. SLP provided education on using intentional circumlocution as strategy to facilitate cohesive discourse and avoid communication breakdown. SLP provided prompts to aid in description ala semantic feature analysis. Following, pt able to generate 3+ features for x7 targets with usual use of questioning cues. Provided written handout to aid in carryover of strategy at home. Updated HEP.  11-01-21: Pt reports carryover of scripts generated previous therapy session. Greeted fitness instructions and several classmates, utilized self-disclosure, and used association strategy to successfully recall SLP's name. Verbalizes appreciation for strategies, saying they were "very helpful." Provided handout to aid in recall of important names. Education on taking time to get  thoughts out and generated several scripts pt can use to request more time. Pt demonstrates with mod-I x3 across therapy session when husband attempts to help during anomia. Husband very receptive to waiting, demonstration of supportive communication techniques of cueing, honoring requests, and providing extended time.   10-23-21: Provided pt with aphasia ID cards, education on aphasia as term for difficulty finding words, despite not being formally dx with primary progressive aphasia. Pt and spouse in agreement re: use of this terminology. Education on benefit of self-disclosure to enable communication with peers at gym. Pt reports feeling self-conscious re: communication difficulties. Generated short script which pt able to demonstrate with visual cue x3. Pt desires to recall names of classmates. Generated script for asking for name and recall strategy of using name, having husband reinforce, and writing down name + salient details following to aid in recall. Pt verbalizes appreciation for interventions provided this date.   PATIENT EDUCATION: Education details: see above Person educated: Patient and Spouse Education method: Explanation, Demonstration, Verbal cues, and Handouts Education comprehension: verbalized understanding, returned demonstration, verbal cues required, and needs further education  GOALS: Goals reviewed with patient? Yes  SHORT TERM GOALS: Target date: 11/20/2021  Pt will name 3+ items across 3 personally relevant categories with occasional min-A Baseline: Goal status: PARTIALLY MET  2.  Pt will complete structured language tasks with 60% accuracy with occasional mod-A over 2 sessions Baseline:  Goal status: MET  3.  Pt will use word finding compensations in 5 minute personally relevant discourse sample with occasional min-A Baseline:  Goal status: MET  4.  Pt will report daily HEP for aphasia and agraphia with mod-I Baseline:  Goal status: MET   LONG TERM GOALS:  Target date: 01/08/2021  Pt will order her own food at restaurant, following script training practice, with use of compensations as needed Baseline:  Goal status: INITIAL  2.  Pt will report carryover of targeted strategies resulting in x3 instances of speaking to fellow gym members over 1 week period Baseline:  Goal status: INITIAL  3.  Pt will complete structured language tasks with 80% accuracy with occasional mod-A over 2 sessions Baseline:  Goal status: INITIAL  4.  Spouse will demonstrate supportive communication techniques to aid in successful determination of preferences, topic identification, or thoughts at home Baseline:  Goal status: INITIAL  5.  Pt will use multimodal communication to relay preferences or thoughts regarding personally relevant topic during structured tasks in 70% of opportunities  over 2 sessions Baseline:  Goal status: INITIAL  ASSESSMENT:  CLINICAL IMPRESSION: Patient is a 76 y.o. F who was seen today for cognitive linguistic evaluation in precense of mild cognitive impairment with questionable underlying neurodegenerative disease. Moderate expressive aphasia, mild impairment of receptive language, accompanying mild to moderate dysgraphia. Pt primarily uses verbal speech to communicate, which is notable for usual vague language, anomia, and dysnomia (mixed paraphasias, with intermittent awareness). Ongoing difficulties with anomia/dysnomia reported since 2019, though pt and spouse endorse overall abilities have remained stable. Expressive language impairment noted in naming, automatic speech, and simple conversation, to include pt's personal experiences. Given verbal cues, pt able to use gestures, writing, and drawing to aid in anomia throughout  session. Dysgraphia reported and evidenced in writing what she had for lunch and in writing her name. Pt and spouse report communication is overall going well at home, and primary strategy in use is use of questions and  determining topic to aid in understanding on husband's part.  Mild receptive language impairment in which pt required usual repetition and direct, simple language with occasional written supports to aid in comprehension. Pt is IND using strategy of asking for repetition when needed. Pt and spouse support cognitive changes in memory but report is not negatively impacting participation in home based of avocational activities at this time. SLP to monitor and will update plan of care should this area become a concern for pt or spouse. Pt primary concern at this time is her communication, reporting she is avoiding talking to all but select few people and desires to increase amount of people she feels comfortable communicating with. SLP recommends skilled ST interventions to enable effective use of compensatory techniques to assist with verbal communication. Additionally, pt may benefit from implementation of augmentative or alternative communication system to support verbal speech in light of potential neurodegenerative etiology, will monitor and assess for suitability and interest over therapy course.   OBJECTIVE IMPAIRMENTS include memory, expressive language, aphasia, and apraxia. These impairments are limiting patient from ADLs/IADLs and effectively communicating at home and in community. Factors affecting potential to achieve goals and functional outcome are ability to learn/carryover information, medical prognosis, previous level of function, and severity of impairments. Patient will benefit from skilled SLP services to address above impairments and improve overall function.  REHAB POTENTIAL: Fair (prognosis ? neurodegenerative disease)  PLAN: SLP FREQUENCY: 2x/week (pt elects for initiation of therapy at 1x week at this time)  SLP DURATION: 12 weeks  PLANNED INTERVENTIONS: Language facilitation, Cueing hierachy, Cognitive reorganization, Internal/external aids, Functional tasks, Multimodal  communication approach, SLP instruction and feedback, Compensatory strategies, and Patient/family education    Su Monks, CCC-SLP 11/20/2021, 12:37 PM

## 2021-11-20 NOTE — Patient Instructions (Addendum)
I have a little black cat. Her name is Melinda Kelly.   We rescued her. She was a baby when we found her.   We've had her for many years.      Considerations for Communication Confidence Easiest with less people So consider social situations with less people  Invite people to lunch or coffee Invite friends to your house Inviting someone to go on a walk Accepting invitations to activities with less people Social situations with more people Try getting friends off to the side to have 1 on 1 conversation with  Look for people sitting or standing alone Invite a close friend to step away to chat  Practice!!  Practice topics which are relevant to you! Gardening [employer] on current events  [employer] Other hobbies   Remember it's the QUALITY of your interactions that matter!!  + Quality looks like: being thoughtful and kind

## 2021-11-29 ENCOUNTER — Ambulatory Visit: Payer: Medicare Other | Admitting: Speech Pathology

## 2021-11-29 DIAGNOSIS — R41841 Cognitive communication deficit: Secondary | ICD-10-CM

## 2021-11-29 DIAGNOSIS — R4701 Aphasia: Secondary | ICD-10-CM

## 2021-11-29 NOTE — Therapy (Signed)
OUTPATIENT SPEECH LANGUAGE PATHOLOGY EVALUATION   Patient Name: Melinda Kelly MRN: [id number] DOB:28-Aug-1945, 76 y.o., female Today's Date: 11/29/2021  PCP: Leamon Arnt, MD REFERRING PROVIDER: Gwendolyn Fill , PA   End of Session - 11/29/21 1230     Visit Number 7    Number of Visits 25    Date for SLP Re-Evaluation 01/08/22    Authorization Type [employer] Medicare    Progress Note Due on Visit 10    SLP Start Time 36    SLP Stop Time  1315    SLP Time Calculation (min) 45 min    Activity Tolerance Patient tolerated treatment well              Past Medical History:  Diagnosis Date   Dyslipidemia    HTN (hypertension)    Memory disorder 07/01/2018   [facility].  MoCa 18/30 12/2017 Brain MRI: sm vessel disease. Apraxia and aphasia   Past Surgical History:  Procedure Laterality Date   BREAST LUMPECTOMY     TANDEM AND OVOID INSERTION     Patient Active Problem List   Diagnosis Date Noted   Memory disorder 07/01/2018   Right leg pain 07/01/2018   Age-related osteoporosis without current pathological fracture 07/01/2018   Mixed hyperlipidemia 03/16/2009   Essential hypertension 03/16/2009    ONSET DATE: 10-02-21 referral    REFERRING DIAG:  G31.84 (ICD-10-CM) - Mild cognitive impairment of uncertain or unknown etiology  F80.9 (ICD-10-CM) - Developmental disorder of speech and language, unspecified    THERAPY DIAG:  Cognitive communication deficit  Aphasia  Rationale for Evaluation and Treatment Rehabilitation  SUBJECTIVE:   SUBJECTIVE STATEMENT: "I'm fine"    PAIN:  Are you having pain? No   OBJECTIVE:   TODAY'S TREATMENT:  11-29-21: Target employment of anomia and communication strategies in conversational speech. Pt able to successfully use extended time, requests for repetition. SLP provided education and modeling of cueing hierarchy for spouse to support pt in communication attempts. Spouse able to demonstrate  semantic cueing to aid pt in word retrieval with verbal cues, with successful retrieval from pt in 2/3 trials. In practice naming tasks, pt able to ask for x3, x2, x2 items across 3 trials with usual mod-A. Plan to target descriptions in upcoming session, as pt reporting can visualize items but not form the words in last naming trial.   11-20-21: Additional education provided for compensations to aid in communication confidence for the aphasic speaker. PT identified that it is more challenging to communicate when there are many people. Generated strategies to facilitate improved communication confidence in this challenging environment, see handout. Generate list of topics pt would like to feel more confident communicating about, will generate scripts in subsequent session. SLP educates on quality of interactions being more important than the actual words said. Given questioning cues, pt able to ID x3 things that she contributes to communication encounters outside of "having her words." Pt ID kindness, listening, and affirmation.   11-15-21: Pt reports to going to church in person and attended a fellowship event after. Reports did not engage much, was overwhelming. Will be going to party this weekend with new people. Generated simple scripts pt can use in small talk conversations. Given visual aid, pt able to verbally produce each x3 with occasional min-A to correct mixed paraphasias. Re-education on using description to aid in anomia with pt requiring max cues to describe x2 when anomia occurred in conversation. Pt most benefits from extended  time and A from listener to aid in clarity of message.   11-09-21: Pt was able to carryover scripts generated last session and had successful visit with friend. Pt with spontaneous use of description to aid in recovery when anomia occurs. SLP provided education on using intentional circumlocution as strategy to facilitate cohesive discourse and avoid communication  breakdown. SLP provided prompts to aid in description ala semantic feature analysis. Following, pt able to generate 3+ features for x7 targets with usual use of questioning cues. Provided written handout to aid in carryover of strategy at home. Updated HEP.   11-01-21: Pt reports carryover of scripts generated previous therapy session. Greeted fitness instructions and several classmates, utilized self-disclosure, and used association strategy to successfully recall SLP's name. Verbalizes appreciation for strategies, saying they were "very helpful." Provided handout to aid in recall of important names. Education on taking time to get thoughts out and generated several scripts pt can use to request more time. Pt demonstrates with mod-I x3 across therapy session when husband attempts to help during anomia. Husband very receptive to waiting, demonstration of supportive communication techniques of cueing, honoring requests, and providing extended time.   10-23-21: Provided pt with aphasia ID cards, education on aphasia as term for difficulty finding words, despite not being formally dx with primary progressive aphasia. Pt and spouse in agreement re: use of this terminology. Education on benefit of self-disclosure to enable communication with peers at gym. Pt reports feeling self-conscious re: communication difficulties. Generated short script which pt able to demonstrate with visual cue x3. Pt desires to recall names of classmates. Generated script for asking for name and recall strategy of using name, having husband reinforce, and writing down name + salient details following to aid in recall. Pt verbalizes appreciation for interventions provided this date.   PATIENT EDUCATION: Education details: see above Person educated: Patient and Spouse Education method: Explanation, Demonstration, Verbal cues, and Handouts Education comprehension: verbalized understanding, returned demonstration, verbal cues required,  and needs further education  GOALS: Goals reviewed with patient? Yes  SHORT TERM GOALS: Target date: 11/20/2021  Pt will name 3+ items across 3 personally relevant categories with occasional min-A Baseline: Goal status: PARTIALLY MET  2.  Pt will complete structured language tasks with 60% accuracy with occasional mod-A over 2 sessions Baseline:  Goal status: MET  3.  Pt will use word finding compensations in 5 minute personally relevant discourse sample with occasional min-A Baseline:  Goal status: MET  4.  Pt will report daily HEP for aphasia and agraphia with mod-I Baseline:  Goal status: MET   LONG TERM GOALS: Target date: 01/08/2021  Pt will order her own food at restaurant, following script training practice, with use of compensations as needed Baseline:  Goal status: INITIAL  2.  Pt will report carryover of targeted strategies resulting in x3 instances of speaking to fellow gym members over 1 week period Baseline:  Goal status: INITIAL  3.  Pt will complete structured language tasks with 80% accuracy with occasional mod-A over 2 sessions Baseline:  Goal status: INITIAL  4.  Spouse will demonstrate supportive communication techniques to aid in successful determination of preferences, topic identification, or thoughts at home Baseline:  Goal status: INITIAL  5.  Pt will use multimodal communication to relay preferences or thoughts regarding personally relevant topic during structured tasks in 70% of opportunities  over 2 sessions Baseline:  Goal status: INITIAL  ASSESSMENT:  CLINICAL IMPRESSION: Patient is a 76 y.o. F who was  seen today for cognitive linguistic evaluation in precense of mild cognitive impairment with questionable underlying neurodegenerative disease. Moderate expressive aphasia, mild impairment of receptive language, accompanying mild to moderate dysgraphia. Pt primarily uses verbal speech to communicate, which is notable for usual vague language,  anomia, and dysnomia (mixed paraphasias, with intermittent awareness). Ongoing difficulties with anomia/dysnomia reported since 2019, though pt and spouse endorse overall abilities have remained stable. Expressive language impairment noted in naming, automatic speech, and simple conversation, to include pt's personal experiences. Given verbal cues, pt able to use gestures, writing, and drawing to aid in anomia throughout session. Dysgraphia reported and evidenced in writing what she had for lunch and in writing her name. Pt and spouse report communication is overall going well at home, and primary strategy in use is use of questions and determining topic to aid in understanding on husband's part.  Mild receptive language impairment in which pt required usual repetition and direct, simple language with occasional written supports to aid in comprehension. Pt is IND using strategy of asking for repetition when needed. Pt and spouse support cognitive changes in memory but report is not negatively impacting participation in home based of avocational activities at this time. SLP to monitor and will update plan of care should this area become a concern for pt or spouse. Pt primary concern at this time is her communication, reporting she is avoiding talking to all but select few people and desires to increase amount of people she feels comfortable communicating with. SLP recommends skilled ST interventions to enable effective use of compensatory techniques to assist with verbal communication. Additionally, pt may benefit from implementation of augmentative or alternative communication system to support verbal speech in light of potential neurodegenerative etiology, will monitor and assess for suitability and interest over therapy course.   OBJECTIVE IMPAIRMENTS include memory, expressive language, aphasia, and apraxia. These impairments are limiting patient from ADLs/IADLs and effectively communicating at home and in  community. Factors affecting potential to achieve goals and functional outcome are ability to learn/carryover information, medical prognosis, previous level of function, and severity of impairments. Patient will benefit from skilled SLP services to address above impairments and improve overall function.  REHAB POTENTIAL: Fair (prognosis ? neurodegenerative disease)  PLAN: SLP FREQUENCY: 2x/week (pt elects for initiation of therapy at 1x week at this time)  SLP DURATION: 12 weeks  PLANNED INTERVENTIONS: Language facilitation, Cueing hierachy, Cognitive reorganization, Internal/external aids, Functional tasks, Multimodal communication approach, SLP instruction and feedback, Compensatory strategies, and Patient/family education    Su Monks, CCC-SLP 11/29/2021, 12:30 PM

## 2021-12-04 ENCOUNTER — Encounter: Payer: Self-pay | Admitting: Speech Pathology

## 2021-12-04 ENCOUNTER — Ambulatory Visit: Payer: Medicare Other | Admitting: Speech Pathology

## 2021-12-04 DIAGNOSIS — R4701 Aphasia: Secondary | ICD-10-CM | POA: Diagnosis not present

## 2021-12-04 DIAGNOSIS — R41841 Cognitive communication deficit: Secondary | ICD-10-CM

## 2021-12-04 NOTE — Therapy (Signed)
OUTPATIENT SPEECH LANGUAGE PATHOLOGY EVALUATION   Patient Name: Melinda Kelly MRN: [id number] DOB:June 14, 1945, 76 y.o., female Today's Date: 12/04/2021  PCP: Leamon Arnt, MD REFERRING PROVIDER: Gwendolyn Fill , PA   End of Session - 12/04/21 1305     Visit Number 8    Number of Visits 25    Date for SLP Re-Evaluation 01/08/22    Authorization Type [employer] Medicare              Past Medical History:  Diagnosis Date   Dyslipidemia    HTN (hypertension)    Memory disorder 07/01/2018   Seeing [facility].  MoCa 18/30 12/2017 Brain MRI: sm vessel disease. Apraxia and aphasia   Past Surgical History:  Procedure Laterality Date   BREAST LUMPECTOMY     TANDEM AND OVOID INSERTION     Patient Active Problem List   Diagnosis Date Noted   Memory disorder 07/01/2018   Right leg pain 07/01/2018   Age-related osteoporosis without current pathological fracture 07/01/2018   Mixed hyperlipidemia 03/16/2009   Essential hypertension 03/16/2009    ONSET DATE: 10-02-21 referral    REFERRING DIAG:  G31.84 (ICD-10-CM) - Mild cognitive impairment of uncertain or unknown etiology  F80.9 (ICD-10-CM) - Developmental disorder of speech and language, unspecified    THERAPY DIAG:  Aphasia  Cognitive communication deficit  Rationale for Evaluation and Treatment Rehabilitation  SUBJECTIVE:   SUBJECTIVE STATEMENT: "I tell people I have trouble"    PAIN:  Are you having pain? No   OBJECTIVE:   TODAY'S TREATMENT:   12-04-21: Sonia Side reports that Brekyn has used script to let others know she has trouble speaking. Utilized semantic feature analysis to target compensations for aphasia as well as word finding. Using photos of Christmas flowers and birds with frequent questioning cues, written cues and choice of 2, Fontella used SFA chart to generate 3-4 descriptions of3 holiday plants ([place], [place], [place] and 4 birds who frequent her bird feeder (blue [place],  cardinal, blue bird, Higher education careers adviser). She used compensations of gestures and 1-2 word descriptions to converse about how she met [place] provided appropriate semantic and questioning cues as well. She required mod A to use her scripts/communication supports to tell me about her hobbies and pet.   11-29-21: Target employment of anomia and communication strategies in conversational speech. Pt able to successfully use extended time, requests for repetition. SLP provided education and modeling of cueing hierarchy for spouse to support pt in communication attempts. Spouse able to demonstrate semantic cueing to aid pt in word retrieval with verbal cues, with successful retrieval from pt in 2/3 trials. In practice naming tasks, pt able to ask for x3, x2, x2 items across 3 trials with usual mod-A. Plan to target descriptions in upcoming session, as pt reporting can visualize items but not form the words in last naming trial.   11-20-21: Additional education provided for compensations to aid in communication confidence for the aphasic speaker. PT identified that it is more challenging to communicate when there are many people. Generated strategies to facilitate improved communication confidence in this challenging environment, see handout. Generate list of topics pt would like to feel more confident communicating about, will generate scripts in subsequent session. SLP educates on quality of interactions being more important than the actual words said. Given questioning cues, pt able to ID x3 things that she contributes to communication encounters outside of "having her words." Pt ID kindness, listening, and affirmation.   11-15-21: Pt  reports to going to church in person and attended a fellowship event after. Reports did not engage much, was overwhelming. Will be going to party this weekend with new people. Generated simple scripts pt can use in small talk conversations. Given visual aid, pt able to verbally produce each  x3 with occasional min-A to correct mixed paraphasias. Re-education on using description to aid in anomia with pt requiring max cues to describe x2 when anomia occurred in conversation. Pt most benefits from extended time and A from listener to aid in clarity of message.   11-09-21: Pt was able to carryover scripts generated last session and had successful visit with friend. Pt with spontaneous use of description to aid in recovery when anomia occurs. SLP provided education on using intentional circumlocution as strategy to facilitate cohesive discourse and avoid communication breakdown. SLP provided prompts to aid in description ala semantic feature analysis. Following, pt able to generate 3+ features for x7 targets with usual use of questioning cues. Provided written handout to aid in carryover of strategy at home. Updated HEP.   11-01-21: Pt reports carryover of scripts generated previous therapy session. Greeted fitness instructions and several classmates, utilized self-disclosure, and used association strategy to successfully recall SLP's name. Verbalizes appreciation for strategies, saying they were "very helpful." Provided handout to aid in recall of important names. Education on taking time to get thoughts out and generated several scripts pt can use to request more time. Pt demonstrates with mod-I x3 across therapy session when husband attempts to help during anomia. Husband very receptive to waiting, demonstration of supportive communication techniques of cueing, honoring requests, and providing extended time.   10-23-21: Provided pt with aphasia ID cards, education on aphasia as term for difficulty finding words, despite not being formally dx with primary progressive aphasia. Pt and spouse in agreement re: use of this terminology. Education on benefit of self-disclosure to enable communication with peers at gym. Pt reports feeling self-conscious re: communication difficulties. Generated short script  which pt able to demonstrate with visual cue x3. Pt desires to recall names of classmates. Generated script for asking for name and recall strategy of using name, having husband reinforce, and writing down name + salient details following to aid in recall. Pt verbalizes appreciation for interventions provided this date.   PATIENT EDUCATION: Education details: see above Person educated: Patient and Spouse Education method: Explanation, Demonstration, Verbal cues, and Handouts Education comprehension: verbalized understanding, returned demonstration, verbal cues required, and needs further education  GOALS: Goals reviewed with patient? Yes  SHORT TERM GOALS: Target date: 11/20/2021  Pt will name 3+ items across 3 personally relevant categories with occasional min-A Baseline: Goal status: PARTIALLY MET  2.  Pt will complete structured language tasks with 60% accuracy with occasional mod-A over 2 sessions Baseline:  Goal status: MET  3.  Pt will use word finding compensations in 5 minute personally relevant discourse sample with occasional min-A Baseline:  Goal status: MET  4.  Pt will report daily HEP for aphasia and agraphia with mod-I Baseline:  Goal status: MET   LONG TERM GOALS: Target date: 01/08/2021  Pt will order her own food at restaurant, following script training practice, with use of compensations as needed Baseline:  Goal status: INITIAL  2.  Pt will report carryover of targeted strategies resulting in x3 instances of speaking to fellow gym members over 1 week period Baseline:  Goal status: INITIAL  3.  Pt will complete structured language tasks with 80% accuracy with  occasional mod-A over 2 sessions Baseline:  Goal status: INITIAL  4.  Spouse will demonstrate supportive communication techniques to aid in successful determination of preferences, topic identification, or thoughts at home Baseline:  Goal status: INITIAL  5.  Pt will use multimodal communication  to relay preferences or thoughts regarding personally relevant topic during structured tasks in 70% of opportunities  over 2 sessions Baseline:  Goal status: INITIAL  ASSESSMENT:  CLINICAL IMPRESSION: Patient is a 76 y.o. F who was seen today for cognitive linguistic evaluation in precense of mild cognitive impairment with questionable underlying neurodegenerative disease. Moderate expressive aphasia, mild impairment of receptive language, accompanying mild to moderate dysgraphia. Pt primarily uses verbal speech to communicate, which is notable for usual vague language, anomia, and dysnomia (mixed paraphasias, with intermittent awareness). Ongoing difficulties with anomia/dysnomia reported since 2019, though pt and spouse endorse overall abilities have remained stable. Expressive language impairment noted in naming, automatic speech, and simple conversation, to include pt's personal experiences. Given verbal cues, pt able to use gestures, writing, and drawing to aid in anomia throughout session. Dysgraphia reported and evidenced in writing what she had for lunch and in writing her name. Pt and spouse report communication is overall going well at home, and primary strategy in use is use of questions and determining topic to aid in understanding on husband's part.  Mild receptive language impairment in which pt required usual repetition and direct, simple language with occasional written supports to aid in comprehension. Pt is IND using strategy of asking for repetition when needed. Pt and spouse support cognitive changes in memory but report is not negatively impacting participation in home based of avocational activities at this time. SLP to monitor and will update plan of care should this area become a concern for pt or spouse. Pt primary concern at this time is her communication, reporting she is avoiding talking to all but select few people and desires to increase amount of people she feels comfortable  communicating with. SLP recommends skilled ST interventions to enable effective use of compensatory techniques to assist with verbal communication. Additionally, pt may benefit from implementation of augmentative or alternative communication system to support verbal speech in light of potential neurodegenerative etiology, will monitor and assess for suitability and interest over therapy course.   OBJECTIVE IMPAIRMENTS include memory, expressive language, aphasia, and apraxia. These impairments are limiting patient from ADLs/IADLs and effectively communicating at home and in community. Factors affecting potential to achieve goals and functional outcome are ability to learn/carryover information, medical prognosis, previous level of function, and severity of impairments. Patient will benefit from skilled SLP services to address above impairments and improve overall function.  REHAB POTENTIAL: Fair (prognosis ? neurodegenerative disease)  PLAN: SLP FREQUENCY: 2x/week (pt elects for initiation of therapy at 1x week at this time)  SLP DURATION: 12 weeks  PLANNED INTERVENTIONS: Language facilitation, Cueing hierachy, Cognitive reorganization, Internal/external aids, Functional tasks, Multimodal communication approach, SLP instruction and feedback, Compensatory strategies, and Patient/family education    Iline Oven, [place] 12/04/2021, 2:13 PM

## 2021-12-11 DIAGNOSIS — Z1231 Encounter for screening mammogram for malignant neoplasm of breast: Secondary | ICD-10-CM | POA: Diagnosis not present

## 2021-12-11 DIAGNOSIS — M810 Age-related osteoporosis without current pathological fracture: Secondary | ICD-10-CM | POA: Diagnosis not present

## 2021-12-11 DIAGNOSIS — Z780 Asymptomatic menopausal state: Secondary | ICD-10-CM | POA: Diagnosis not present

## 2021-12-13 ENCOUNTER — Ambulatory Visit: Payer: Medicare Other | Attending: Neurology | Admitting: Speech Pathology

## 2021-12-13 DIAGNOSIS — R4701 Aphasia: Secondary | ICD-10-CM | POA: Diagnosis not present

## 2021-12-13 DIAGNOSIS — R41841 Cognitive communication deficit: Secondary | ICD-10-CM | POA: Diagnosis not present

## 2021-12-13 NOTE — Therapy (Signed)
OUTPATIENT SPEECH LANGUAGE PATHOLOGY EVALUATION   Patient Name: Melinda Kelly MRN: [id number] DOB:[birth year], 76 y.o., female Today's Date: 12/13/2021  PCP: Leamon Arnt, MD REFERRING PROVIDER: Gwendolyn Fill , PA   End of Session - 12/13/21 1228     Visit Number 9    Number of Visits 25    Date for SLP Re-Evaluation 01/08/22    Authorization Type [employer] Medicare    SLP Start Time 1025    SLP Stop Time  8527    SLP Time Calculation (min) 45 min    Activity Tolerance Patient tolerated treatment well              Past Medical History:  Diagnosis Date   Dyslipidemia    HTN (hypertension)    Memory disorder 07/01/2018   [facility].  MoCa 18/30 12/2017 Brain MRI: sm vessel disease. Apraxia and aphasia   Past Surgical History:  Procedure Laterality Date   BREAST LUMPECTOMY     TANDEM AND OVOID INSERTION     Patient Active Problem List   Diagnosis Date Noted   Memory disorder 07/01/2018   Right leg pain 07/01/2018   Age-related osteoporosis without current pathological fracture 07/01/2018   Mixed hyperlipidemia 03/16/2009   Essential hypertension 03/16/2009    ONSET DATE: 10-02-21 referral    REFERRING DIAG:  G31.84 (ICD-10-CM) - Mild cognitive impairment of uncertain or unknown etiology  F80.9 (ICD-10-CM) - Developmental disorder of speech and language, unspecified    THERAPY DIAG:  Aphasia  Cognitive communication deficit  Rationale for Evaluation and Treatment Rehabilitation  SUBJECTIVE:   SUBJECTIVE STATEMENT: "We had a party"    PAIN:  Are you having pain? No  SPEECH THERAPY DISCHARGE SUMMARY  Visits from Start of Care: 9  Current functional level related to goals / functional outcomes: Subjective perception on improved confidence in initiating and participating in conversations across increased variety of communication partners. Some improvement of verbal expression for familiar topics using scripting. Husband  demonstrates ability to employ supportive communication strategies with mod-I.    Remaining deficits: Expressive > receptive aphasia, cognitive impairment   Education / Equipment: Anomia strategies/compensations, scripts, memory strategies/compensations, supportive communication strategies    Patient agrees to discharge. Patient goals were partially met. Patient is being discharged due to being pleased with the current functional level.    OBJECTIVE:   TODAY'S TREATMENT:   12-13-21: Pt arrived with [facility] completed. Able to read to SLP, then answer 9/15 questions regarding the material. Requires occasional A from husband for correctness of response. Husband evidences supportive communication techniques of confirmation, providing options, and semantic cueing to A pt in expressive communication. Discussion on accomplishments evidenced over therapy course- pt endorses primary accomplishment has been increased confidence in communicating with variety of partners. Additionally, self-disclosure strategy has proven very beneficial for pt. When asked if ready for d/c, pt says "I'm happy to." Verbalizes acceptance that word finding difficulties will persist. Reports difficulties remain stable.   12-04-21: Sonia Side reports that Katherin has used script to let others know she has trouble speaking. Utilized semantic feature analysis to target compensations for aphasia as well as word finding. Using photos of Christmas flowers and birds with frequent questioning cues, written cues and choice of 2, Jasira used SFA chart to generate 3-4 descriptions of3 holiday plants ([place], [place], [place] and 4 birds who frequent her bird feeder (blue [place], cardinal, blue bird, Higher education careers adviser). She used compensations of gestures and 1-2 word descriptions to converse about how  she met [place] provided appropriate semantic and questioning cues as well. She required mod A to use her scripts/communication supports to tell me about her  hobbies and pet.   11-29-21: Target employment of anomia and communication strategies in conversational speech. Pt able to successfully use extended time, requests for repetition. SLP provided education and modeling of cueing hierarchy for spouse to support pt in communication attempts. Spouse able to demonstrate semantic cueing to aid pt in word retrieval with verbal cues, with successful retrieval from pt in 2/3 trials. In practice naming tasks, pt able to ask for x3, x2, x2 items across 3 trials with usual mod-A. Plan to target descriptions in upcoming session, as pt reporting can visualize items but not form the words in last naming trial.    PATIENT EDUCATION: Education details: see above Person educated: Patient and Spouse Education method: Explanation, Demonstration, Verbal cues, and Handouts Education comprehension: verbalized understanding, returned demonstration, verbal cues required, and needs further education  GOALS: Goals reviewed with patient? Yes  SHORT TERM GOALS: Target date: 11/20/2021  Pt will name 3+ items across 3 personally relevant categories with occasional min-A Baseline: Goal status: PARTIALLY MET  2.  Pt will complete structured language tasks with 60% accuracy with occasional mod-A over 2 sessions Baseline:  Goal status: MET  3.  Pt will use word finding compensations in 5 minute personally relevant discourse sample with occasional min-A Baseline:  Goal status: MET  4.  Pt will report daily HEP for aphasia and agraphia with mod-I Baseline:  Goal status: MET   LONG TERM GOALS: Target date: 01/08/2021  Pt will order her own food at restaurant, following script training practice, with use of compensations as needed Baseline:  Goal status: NOT MET  2.  Pt will report carryover of targeted strategies resulting in x3 instances of speaking to fellow gym members over 1 week period Baseline: 12/13/2021 Goal status: MET  3.  Pt will complete structured  language tasks with 80% accuracy with occasional mod-A over 2 sessions Baseline:  Goal status: PARTIALLY MET  4.  Spouse will demonstrate supportive communication techniques to aid in successful determination of preferences, topic identification, or thoughts at home Baseline: 12/04/2021 Goal status: MET  5.  Pt will use multimodal communication to relay preferences or thoughts regarding personally relevant topic during structured tasks in 70% of opportunities  over 2 sessions Baseline: 12-13-21 Goal status: MET  ASSESSMENT:  CLINICAL IMPRESSION: Therapy course primarily focused on use of compensations and strategies for pt to employ to increase confidence in communication across communication partners. Persisting expressive aphasia, mild impairment of receptive language, accompanying mild to moderate dysgraphia. Verbal speech is notable for usual vague language, anomia, and dysnomia (mixed paraphasias, with intermittent awareness).  Mild receptive language impairment in which pt required usual repetition and direct, simple language with occasional written supports to aid in comprehension. Pt is IND using strategy of asking for repetition when needed. Pt now communicating more comfortably with women whom she sees at the gym and has invited friends to home for tea. Verbalizes appreciation for all interventions, is pleased with current functional level. Agreeable to dc.   OBJECTIVE IMPAIRMENTS include memory, expressive language, aphasia, and apraxia. These impairments are limiting patient from ADLs/IADLs and effectively communicating at home and in community. Factors affecting potential to achieve goals and functional outcome are ability to learn/carryover information, medical prognosis, previous level of function, and severity of impairments. Patient will benefit from skilled SLP services to address above  impairments and improve overall function.  REHAB POTENTIAL: Fair (prognosis ?  neurodegenerative disease)  PLAN: SLP FREQUENCY: 2x/week (pt elects for initiation of therapy at 1x week at this time)  SLP DURATION: 12 weeks  PLANNED INTERVENTIONS: Language facilitation, Cueing hierachy, Cognitive reorganization, Internal/external aids, Functional tasks, Multimodal communication approach, SLP instruction and feedback, Compensatory strategies, and Patient/family education    Su Monks, CCC-SLP 12/13/2021, 12:28 PM

## 2022-01-12 DIAGNOSIS — H2512 Age-related nuclear cataract, left eye: Secondary | ICD-10-CM | POA: Diagnosis not present

## 2022-01-12 DIAGNOSIS — H2511 Age-related nuclear cataract, right eye: Secondary | ICD-10-CM | POA: Diagnosis not present

## 2022-01-16 DIAGNOSIS — H2512 Age-related nuclear cataract, left eye: Secondary | ICD-10-CM | POA: Diagnosis not present

## 2022-01-26 DIAGNOSIS — H2512 Age-related nuclear cataract, left eye: Secondary | ICD-10-CM | POA: Diagnosis not present

## 2022-03-02 DIAGNOSIS — L718 Other rosacea: Secondary | ICD-10-CM | POA: Diagnosis not present

## 2022-03-02 DIAGNOSIS — L821 Other seborrheic keratosis: Secondary | ICD-10-CM | POA: Diagnosis not present

## 2022-03-02 DIAGNOSIS — D225 Melanocytic nevi of trunk: Secondary | ICD-10-CM | POA: Diagnosis not present

## 2022-03-02 DIAGNOSIS — L814 Other melanin hyperpigmentation: Secondary | ICD-10-CM | POA: Diagnosis not present

## 2022-03-20 DIAGNOSIS — K080 Exfoliation of teeth due to systemic causes: Secondary | ICD-10-CM | POA: Diagnosis not present

## 2022-04-11 DIAGNOSIS — H00022 Hordeolum internum right lower eyelid: Secondary | ICD-10-CM | POA: Diagnosis not present

## 2022-06-18 DIAGNOSIS — L237 Allergic contact dermatitis due to plants, except food: Secondary | ICD-10-CM | POA: Diagnosis not present

## 2022-06-19 DIAGNOSIS — L237 Allergic contact dermatitis due to plants, except food: Secondary | ICD-10-CM | POA: Diagnosis not present

## 2022-09-06 DIAGNOSIS — H5203 Hypermetropia, bilateral: Secondary | ICD-10-CM | POA: Diagnosis not present

## 2022-10-02 DIAGNOSIS — K080 Exfoliation of teeth due to systemic causes: Secondary | ICD-10-CM | POA: Diagnosis not present

## 2022-11-01 DIAGNOSIS — M810 Age-related osteoporosis without current pathological fracture: Secondary | ICD-10-CM | POA: Diagnosis not present

## 2022-11-01 DIAGNOSIS — Z0000 Encounter for general adult medical examination without abnormal findings: Secondary | ICD-10-CM | POA: Diagnosis not present

## 2022-11-01 DIAGNOSIS — I10 Essential (primary) hypertension: Secondary | ICD-10-CM | POA: Diagnosis not present

## 2022-11-01 DIAGNOSIS — E782 Mixed hyperlipidemia: Secondary | ICD-10-CM | POA: Diagnosis not present

## 2022-11-01 DIAGNOSIS — R482 Apraxia: Secondary | ICD-10-CM | POA: Diagnosis not present

## 2023-03-06 DIAGNOSIS — D225 Melanocytic nevi of trunk: Secondary | ICD-10-CM | POA: Diagnosis not present

## 2023-03-06 DIAGNOSIS — L718 Other rosacea: Secondary | ICD-10-CM | POA: Diagnosis not present

## 2023-03-06 DIAGNOSIS — L821 Other seborrheic keratosis: Secondary | ICD-10-CM | POA: Diagnosis not present

## 2023-03-06 DIAGNOSIS — L814 Other melanin hyperpigmentation: Secondary | ICD-10-CM | POA: Diagnosis not present

## 2023-04-03 DIAGNOSIS — K080 Exfoliation of teeth due to systemic causes: Secondary | ICD-10-CM | POA: Diagnosis not present

## 2023-06-18 DIAGNOSIS — E559 Vitamin D deficiency, unspecified: Secondary | ICD-10-CM | POA: Diagnosis not present

## 2023-06-18 DIAGNOSIS — G3184 Mild cognitive impairment, so stated: Secondary | ICD-10-CM | POA: Diagnosis not present

## 2023-06-18 DIAGNOSIS — Z0000 Encounter for general adult medical examination without abnormal findings: Secondary | ICD-10-CM | POA: Diagnosis not present

## 2023-06-18 DIAGNOSIS — I10 Essential (primary) hypertension: Secondary | ICD-10-CM | POA: Diagnosis not present

## 2023-06-18 DIAGNOSIS — Z13220 Encounter for screening for lipoid disorders: Secondary | ICD-10-CM | POA: Diagnosis not present

## 2023-06-18 DIAGNOSIS — H6123 Impacted cerumen, bilateral: Secondary | ICD-10-CM | POA: Diagnosis not present

## 2023-06-18 DIAGNOSIS — M810 Age-related osteoporosis without current pathological fracture: Secondary | ICD-10-CM | POA: Diagnosis not present

## 2023-07-24 DIAGNOSIS — H00025 Hordeolum internum left lower eyelid: Secondary | ICD-10-CM | POA: Diagnosis not present

## 2023-07-30 DIAGNOSIS — Z23 Encounter for immunization: Secondary | ICD-10-CM | POA: Diagnosis not present

## 2023-07-30 DIAGNOSIS — L719 Rosacea, unspecified: Secondary | ICD-10-CM | POA: Diagnosis not present

## 2023-07-30 DIAGNOSIS — I10 Essential (primary) hypertension: Secondary | ICD-10-CM | POA: Diagnosis not present

## 2023-07-30 DIAGNOSIS — G3184 Mild cognitive impairment, so stated: Secondary | ICD-10-CM | POA: Diagnosis not present

## 2023-07-30 DIAGNOSIS — Z0000 Encounter for general adult medical examination without abnormal findings: Secondary | ICD-10-CM | POA: Diagnosis not present

## 2023-07-30 DIAGNOSIS — M810 Age-related osteoporosis without current pathological fracture: Secondary | ICD-10-CM | POA: Diagnosis not present

## 2023-10-16 DIAGNOSIS — H52223 Regular astigmatism, bilateral: Secondary | ICD-10-CM | POA: Diagnosis not present

## 2023-10-21 DIAGNOSIS — K080 Exfoliation of teeth due to systemic causes: Secondary | ICD-10-CM | POA: Diagnosis not present

## 2023-10-22 ENCOUNTER — Ambulatory Visit: Admitting: Diagnostic Neuroimaging

## 2023-10-22 ENCOUNTER — Encounter: Payer: Self-pay | Admitting: Diagnostic Neuroimaging

## 2023-10-22 VITALS — BP 112/68 | HR 89 | Ht 64.0 in | Wt 124.2 lb

## 2023-10-22 DIAGNOSIS — G3101 Pick's disease: Secondary | ICD-10-CM | POA: Diagnosis not present

## 2023-10-22 DIAGNOSIS — R413 Other amnesia: Secondary | ICD-10-CM | POA: Diagnosis not present

## 2023-10-22 DIAGNOSIS — F0280 Dementia in other diseases classified elsewhere without behavioral disturbance: Secondary | ICD-10-CM

## 2023-10-22 NOTE — Patient Instructions (Addendum)
  LANGUAGE IMPAIRMENT / COGNITIVE DECLINE (since ~2018; now with decline in ADLs; decline in cognitive testing from 2019 to 2023) MoCA 18/30 (2019) --> 22/30 (2020) --> 15/30 (2021) --> 0/30 (could not complete due to severe aphasia 10/22/23) MMSE 12/30 (09/06/21) - check ATN panel (to better confirm / define the diagnosis) - discussed MRI brain, but not likely to change management at this time; will hold off for now - try to stay active physically and get some exercise (at least 15-30 minutes per day) - eat a nutritious diet with lean protein, plants / vegetables, whole grains; avoid ultra-processed foods - increase social activities, brain stimulation, games, puzzles, hobbies, crafts, arts, music; try new activities; keep it fun! - aim for at least 7-8 hours sleep per night (or more) - avoid smoking and alcohol - needs help with medications, finances; no driving - safety / supervision issues reviewed - caregiver resources provided (including [email or web address])

## 2023-10-22 NOTE — Progress Notes (Signed)
 [facility]  PATIENT: Melinda Kelly DOB: 1945-11-09  REFERRING CLINICIAN: Delayne Artist PARAS, MD HISTORY FROM: patient  REASON FOR VISIT: new consult   HISTORICAL  CHIEF COMPLAINT:  Chief Complaint  Patient presents with   RM 6     Patient is here with husband for memory -     HISTORY OF PRESENT ILLNESS:   78 year old female here for evaluation of memory loss.  2018 patient had onset of word finding difficulties, sometimes substituting 1 word for another.  The symptoms progressed over time.  Patient was an [place] major and this was quite unusual for her.  She had eventually saw neurology at [facility] in 2019.  She was diagnosed with possible onset of neurodegenerative disorder such as primary progressive aphasia or Alzheimer's disease.  At the time ADLs were not affected.  Symptoms are moderate over time.  Symptoms have unfortunate progressed.  Patient and husband now live at friend's home.  Patient has had decline in ADLs as a result of cognitive decline.   REVIEW OF SYSTEMS: Full 14 system review of systems performed and negative with exception of: As per HPI.  ALLERGIES: No Known Allergies  HOME MEDICATIONS: Outpatient Medications Prior to Visit  Medication Sig Dispense Refill   alendronate (FOSAMAX) 10 MG tablet Take 10 mg by mouth daily.     amLODipine (NORVASC) 5 MG tablet Take by mouth.     Boron 3 MG CAPS Take 3 mg by mouth daily.     Cholecalciferol (VITAMIN D) 2000 UNITS tablet Take 2,000 Units by mouth daily.       Magnesium 250 MG TABS Take 1 tablet by mouth 2 (two) times a day.     Vitamin K, Phytonadione, 100 MCG TABS Take 1 tablet by mouth daily.     No facility-administered medications prior to visit.    PAST MEDICAL HISTORY: Past Medical History:  Diagnosis Date   Dyslipidemia    HTN (hypertension)    Memory disorder 07/01/2018   Seeing [facility].  MoCa 18/30 12/2017 Brain MRI: sm vessel disease. Apraxia and aphasia     PAST SURGICAL HISTORY: Past Surgical History:  Procedure Laterality Date   BREAST LUMPECTOMY     TANDEM AND OVOID INSERTION      FAMILY HISTORY: History reviewed. No pertinent family history.  SOCIAL HISTORY: Social History   Socioeconomic History   Marital status: Married    Spouse name: Not on file   Number of children: 0   Years of education: Not on file   Highest education level: Not on file  Occupational History   Occupation: retired Tax inspector  Tobacco Use   Smoking status: Former   Smokeless tobacco: Never   Tobacco comments:    no tobacco   Substance and Sexual Activity   Alcohol use: Yes    Comment: occasional    Drug use: Never   Sexual activity: Not Currently  Other Topics Concern   Not on file  Social History Narrative   Married.       2 cups of caffeine daily    Social Drivers of Corporate investment banker Strain: Not on file  Food Insecurity: Low Risk  (10/29/2022)   Received from [facility]   Hunger Vital Sign    Within the past 12 months, you worried that your food would run out before you got money to buy more: Never true    Within the past 12 months, the food you bought  just didn't last and you didn't have money to get more. : Never true  Transportation Needs: No Transportation Needs (10/29/2022)   Received from [facility]    In the past 12 months, has lack of reliable transportation kept you from medical appointments, meetings, work or from getting things needed for daily living? : No  Physical Activity: Not on file  Stress: Not on file  Social Connections: Not on file  Intimate Partner Violence: Not on file     PHYSICAL EXAM  GENERAL EXAM/CONSTITUTIONAL: Vitals:  Vitals:   10/22/23 0928  BP: 112/68  Pulse: 89  SpO2: 99%  Weight: 124 lb 3.2 oz (56.3 kg)  Height: 5' 4 (1.626 m)   Body mass index is 21.32 kg/m. Wt Readings from Last 3 Encounters:  10/22/23 124 lb 3.2 oz (56.3 kg)  07/01/18  112 lb 3.2 oz (50.9 kg)   Patient is in no distress; well developed, nourished and groomed; neck is supple  CARDIOVASCULAR: Examination of carotid arteries is normal; no carotid bruits Regular rate and rhythm, no murmurs Examination of peripheral vascular system by observation and palpation is normal  EYES: Ophthalmoscopic exam of optic discs and posterior segments is normal; no papilledema or hemorrhages No results found.  MUSCULOSKELETAL: Gait, strength, tone, movements noted in Neurologic exam below  NEUROLOGIC: MENTAL STATUS:     10/22/2023    9:34 AM  MMSE - Mini Mental State Exam  Not completed: Refused  Orientation to time 0  Orientation to Place 0   awake, alert, oriented to person MOD-SEVERE EXPRESSIVE APHASIA (CANNOT NAME GLOVE OR THE COLOR BLUE; ABLE TO COUNT FINGERS; ABLE TO RESPOND IN SHORT WORDS / PHRASES) COMPREHENSION SLIGHTLY AFFECTED  CRANIAL NERVE:  2nd, 3rd, 4th, 6th - pupils equal and reactive to light, visual fields full to confrontation, extraocular muscles intact, no nystagmus 5th - facial sensation symmetric 7th - facial strength symmetric 8th - hearing intact 9th - palate elevates symmetrically, uvula midline 11th - shoulder shrug symmetric 12th - tongue protrusion midline  MOTOR:  normal bulk and tone, full strength in the BUE, BLE  SENSORY:  normal and symmetric to light touch, temperature, vibration  COORDINATION:  finger-nose-finger, fine finger movements normal  REFLEXES:  deep tendon reflexes 1+ and symmetric POSITIVE SNOUT REFLEX POSITIVE PALMOMENTAL REFLEX  GAIT/STATION:  narrow based gait     DIAGNOSTIC DATA (LABS, IMAGING, TESTING) - I reviewed patient records, labs, notes, testing and imaging myself where available.  No results found for: WBC, HGB, HCT, MCV, PLT    Component Value Date/Time   CREATININE 0.60 12/04/2017 0931   No results found for: CHOL, HDL, LDLCALC, LDLDIRECT, TRIG,  CHOLHDL Lab Results  Component Value Date   HGBA1C 5.6 03/05/2018   No results found for: VITAMINB12 No results found for: TSH  12/04/17 MRI brain - Mild atrophy and mild chronic microvascular ischemia. No acute abnormality.   ASSESSMENT AND PLAN  78 y.o. year old female here with:  Dx:  1. Primary progressive aphasia (HCC)   2. Memory loss     PLAN:  LANGUAGE IMPAIRMENT / COGNITIVE DECLINE (since ~2018; now with decline in ADLs; decline in cognitive testing from 2019 to 2023; ddx: PPA vs AD vs FTD) MoCA 18/30 (2019) --> 22/30 (2020) --> 15/30 (2021) --> 0/30 (could not complete due to severe aphasia 10/22/23) MMSE 12/30 (09/06/21) - check ATN panel (to better confirm / define the diagnosis) - discussed MRI brain, but not likely to change management at  this time; will hold off for now - try to stay active physically and get some exercise (at least 15-30 minutes per day) - eat a nutritious diet with lean protein, plants / vegetables, whole grains; avoid ultra-processed foods - increase social activities, brain stimulation, games, puzzles, hobbies, crafts, arts, music; try new activities; keep it fun! - aim for at least 7-8 hours sleep per night (or more) - avoid smoking and alcohol - needs help with medications, finances; no driving - safety / supervision issues reviewed - caregiver resources provided (including [email or web address])  Orders Placed This Encounter  Procedures   ATN PROFILE   Return for pending if symptoms worsen or fail to improve, pending test results, return to PCP.    EDUARD FABIENE HANLON, MD 10/22/2023, 10:16 AM Certified in Neurology, Neurophysiology and Neuroimaging  [facility] [address] [place], [place] [address] [phone]

## 2023-10-25 ENCOUNTER — Ambulatory Visit: Payer: Self-pay | Admitting: Diagnostic Neuroimaging

## 2023-10-25 LAB — ATN PROFILE
A -- Beta-amyloid 42/40 Ratio: 0.105 (ref >0.102)
Beta-amyloid 40: 157.39 pg/mL
Beta-amyloid 42: 16.57 pg/mL
N -- NfL, Plasma: 3.86 pg/mL (ref 0.00–6.04)
T -- p-tau181: 1.4 pg/mL — ABNORMAL HIGH (ref 0.00–0.97)
